# Patient Record
Sex: Male | Born: 1970 | Race: White | Hispanic: No | Marital: Married | Smoking: Former smoker
Health system: Southern US, Community
[De-identification: ages and names within clinical notes are randomized; demographics above are authoritative.]

## PROBLEM LIST (undated history)

## (undated) DIAGNOSIS — Z789 Other specified health status: Secondary | ICD-10-CM

## (undated) DIAGNOSIS — E785 Hyperlipidemia, unspecified: Secondary | ICD-10-CM

## (undated) DIAGNOSIS — E538 Deficiency of other specified B group vitamins: Secondary | ICD-10-CM

## (undated) DIAGNOSIS — D72829 Elevated white blood cell count, unspecified: Secondary | ICD-10-CM

## (undated) DIAGNOSIS — L409 Psoriasis, unspecified: Secondary | ICD-10-CM

## (undated) DIAGNOSIS — I10 Essential (primary) hypertension: Secondary | ICD-10-CM

## (undated) HISTORY — DX: Elevated white blood cell count, unspecified: D72.829

## (undated) HISTORY — PX: NO PAST SURGERIES: SHX2092

## (undated) HISTORY — DX: Deficiency of other specified B group vitamins: E53.8

## (undated) HISTORY — DX: Psoriasis, unspecified: L40.9

---

## 2004-07-19 ENCOUNTER — Other Ambulatory Visit: Payer: Self-pay

## 2004-07-20 ENCOUNTER — Observation Stay: Payer: Self-pay

## 2005-03-16 ENCOUNTER — Emergency Department: Payer: Self-pay | Admitting: Emergency Medicine

## 2006-01-30 ENCOUNTER — Emergency Department: Payer: Self-pay | Admitting: Emergency Medicine

## 2006-07-06 ENCOUNTER — Emergency Department: Payer: Self-pay | Admitting: Internal Medicine

## 2006-07-13 ENCOUNTER — Emergency Department: Payer: Self-pay | Admitting: Emergency Medicine

## 2006-07-13 ENCOUNTER — Other Ambulatory Visit: Payer: Self-pay

## 2006-07-19 ENCOUNTER — Emergency Department: Payer: Self-pay | Admitting: Emergency Medicine

## 2006-07-19 ENCOUNTER — Other Ambulatory Visit: Payer: Self-pay

## 2007-07-18 ENCOUNTER — Emergency Department: Payer: Self-pay | Admitting: Emergency Medicine

## 2008-05-13 ENCOUNTER — Emergency Department: Payer: Self-pay | Admitting: Internal Medicine

## 2008-07-01 ENCOUNTER — Emergency Department: Payer: Self-pay | Admitting: Emergency Medicine

## 2008-09-02 ENCOUNTER — Emergency Department: Payer: Self-pay | Admitting: Emergency Medicine

## 2008-09-07 ENCOUNTER — Inpatient Hospital Stay: Payer: Self-pay | Admitting: Psychiatry

## 2010-06-20 ENCOUNTER — Emergency Department: Payer: Self-pay | Admitting: Emergency Medicine

## 2010-11-11 ENCOUNTER — Emergency Department: Payer: Self-pay | Admitting: Emergency Medicine

## 2012-10-31 ENCOUNTER — Emergency Department: Payer: Self-pay | Admitting: Emergency Medicine

## 2014-02-09 ENCOUNTER — Emergency Department: Payer: Self-pay | Admitting: Emergency Medicine

## 2016-07-03 ENCOUNTER — Emergency Department
Admission: EM | Admit: 2016-07-03 | Discharge: 2016-07-03 | Disposition: A | Discharge status: Home / Self Care | Payer: Self-pay | Attending: Emergency Medicine | Admitting: Emergency Medicine

## 2016-07-03 ENCOUNTER — Emergency Department: Payer: Self-pay

## 2016-07-03 ENCOUNTER — Encounter: Payer: Self-pay | Admitting: Emergency Medicine

## 2016-07-03 DIAGNOSIS — M7712 Lateral epicondylitis, left elbow: Secondary | ICD-10-CM | POA: Insufficient documentation

## 2016-07-03 DIAGNOSIS — F17200 Nicotine dependence, unspecified, uncomplicated: Secondary | ICD-10-CM | POA: Insufficient documentation

## 2016-07-03 MED ORDER — KETOROLAC TROMETHAMINE 30 MG/ML IJ SOLN
30.0000 mg | Freq: Once | INTRAMUSCULAR | Status: AC
  Administered 2016-07-03: 30 mg via INTRAMUSCULAR
  Filled 2016-07-03: qty 1

## 2016-07-03 MED ORDER — MELOXICAM 15 MG PO TABS: 15.0000 mg | ORAL_TABLET | Freq: Every day | ORAL | 0 refills | Status: DC

## 2016-07-03 NOTE — ED Provider Notes (Signed)
[facility] Emergency Department Provider Note  ____________________________________________  Time seen: Approximately 11:02 PM  I have reviewed the triage vital signs and the nursing notes.   HISTORY  Chief Complaint Arm Pain    HPI Bruce Kane is a 45 y.o. male who presents emergency department complaining of left elbow pain. Patient denies any specific injury. He reports the pain is the lateral aspect of the elbow. Has been ongoing 3 weeks. It is slowly increasing in intensity. Patient will occasionally have pain radiating down to his fingers. Patient denies any shoulder pain. Patient is not taking medications prior to arrival. Patient reports that the pain is constant, worse with movement or lifting, described as sharp.   History reviewed. No pertinent past medical history.  There are no active problems to display for this patient.   No past surgical history on file.  Prior to Admission medications   Medication Sig Start Date End Date Taking? Authorizing Provider  meloxicam (MOBIC) 15 MG tablet Take 1 tablet (15 mg total) by mouth daily. 07/03/16   Delorise RoyalsJonathan D Cuthriell, PA-C    Allergies Review of patient's allergies indicates no known allergies.  History reviewed. No pertinent family history.  Social History Social History  Substance Use Topics  . Smoking status: Not on file  . Smokeless tobacco: Not on file  . Alcohol use Not on file     Review of Systems  Constitutional: No fever/chills Cardiovascular: no chest pain. Respiratory: no cough. No SOB. Musculoskeletal: Positive for left elbow pain Skin: Negative for rash, abrasions, lacerations, ecchymosis. Neurological: Negative for headaches, focal weakness or numbness. 10-point ROS otherwise negative.  ____________________________________________   PHYSICAL EXAM:  VITAL SIGNS: ED Triage Vitals  Enc Vitals Group     BP 07/03/16 2247 133/81     Pulse Rate 07/03/16 2247  71     Resp 07/03/16 2247 17     Temp 07/03/16 2247 97.9 F (36.6 C)     Temp Source 07/03/16 2247 Oral     SpO2 07/03/16 2247 99 %     Weight 07/03/16 2248 178 lb (80.7 kg)     Height 07/03/16 2248 5\' 8"  (1.727 m)     Head Circumference --      Peak Flow --      Pain Score 07/03/16 2248 8     Pain Loc --      Pain Edu? --      Excl. in GC? --      Constitutional: Alert and oriented. Well appearing and in no acute distress. Eyes: Conjunctivae are normal. PERRL. EOMI. Head: Atraumatic. Cardiovascular: Normal rate, regular rhythm. Normal S1 and S2.  Good peripheral circulation. Respiratory: Normal respiratory effort without tachypnea or retractions. Lungs CTAB. Good air entry to the bases with no decreased or absent breath sounds. Musculoskeletal: Full range of motion to all extremities. No gross deformities appreciated. No visible deformity to left elbow. Inspection. Mild edema to the lateral epicondyle region. Patient is very tender to palpation over the lateral epicondyle. No palpable abnormality. Patient is mildly tender to palpation posterior and medial aspects of the elbow. Full range of motion. Examination of the wrist and shoulder are unremarkable. Dorsalis pulse intact distally. Sensation intact 5 digits distally. Neurologic:  Normal speech and language. No gross focal neurologic deficits are appreciated.  Skin:  Skin is warm, dry and intact. No rash noted. Psychiatric: Mood and affect are normal. Speech and behavior are normal. Patient exhibits appropriate insight and judgement.  ____________________________________________   LABS (all labs ordered are listed, but only abnormal results are displayed)  Labs Reviewed - No data to display ____________________________________________  EKG   ____________________________________________  RADIOLOGY Festus BarrenI, Jonathan D Cuthriell, personally viewed and evaluated these images (plain radiographs) as part of my medical decision  making, as well as reviewing the written report by the radiologist.  Dg Elbow Complete Left  Result Date: 07/03/2016 CLINICAL DATA:  Nontraumatic left elbow pain for 10 days. EXAM: LEFT ELBOW - COMPLETE 3+ VIEW COMPARISON:  None. FINDINGS: There is no evidence of fracture, dislocation, or joint effusion. There is no evidence of arthropathy or other focal bone abnormality. Soft tissues are unremarkable. IMPRESSION: Negative. Electronically Signed   By: Ellery Plunkaniel R Mitchell M.D.   On: 07/03/2016 22:53    ____________________________________________    PROCEDURES  Procedure(s) performed:    Procedures    Medications  ketorolac (TORADOL) 30 MG/ML injection 30 mg (30 mg Intramuscular Given 07/03/16 2317)     ____________________________________________   INITIAL IMPRESSION / ASSESSMENT AND PLAN / ED COURSE  Pertinent labs & imaging results that were available during my care of the patient were reviewed by me and considered in my medical decision making (see chart for details).  Review of the [place] CSRS was performed in accordance of the [facility] prior to dispensing any controlled drugs.  Clinical Course    Patient's diagnosis is consistent with Lateral epicondylitis. X-ray was no acute osseous antibiotic. Exam is reassuring the patient be neurovascularly intact distally. No other concerning signs or symptoms. Patient is given Toradol injection and sling in the emergency department.. Patient will be discharged home with prescriptions for anti-inflammatories for symptom control. Patient is to follow up with orthopedics as needed or otherwise directed. Patient is given ED precautions to return to the ED for any worsening or new symptoms.     ____________________________________________  FINAL CLINICAL IMPRESSION(S) / ED DIAGNOSES  Final diagnoses:  Lateral epicondylitis of left elbow      NEW MEDICATIONS STARTED DURING THIS VISIT:  New Prescriptions   MELOXICAM (MOBIC) 15 MG  TABLET    Take 1 tablet (15 mg total) by mouth daily.        This chart was dictated using voice recognition software/Dragon. Despite best efforts to proofread, errors can occur which can change the meaning. Any change was purely unintentional.    Racheal PatchesJonathan D Cuthriell, PA-C 07/03/16 [id number]    Sharman CheekPhillip Stafford, MD 07/03/16 2351

## 2016-07-03 NOTE — ED Triage Notes (Signed)
Pt comes in c/o left elbow pain that started the weekend before last. Reports that the pain is throbbing and sharp in nature. Left elbow has noticeable swelling to it, pt reports unable to grip due to pain, limited ROM due to pain. No known injury.

## 2016-07-03 NOTE — ED Notes (Signed)
Discharge instructions reviewed with patient. Patient verbalized understanding. Patient ambulated to lobby without difficulty.   

## 2016-07-06 ENCOUNTER — Encounter: Payer: Self-pay | Admitting: Emergency Medicine

## 2016-07-06 ENCOUNTER — Emergency Department
Admission: EM | Admit: 2016-07-06 | Discharge: 2016-07-06 | Disposition: A | Discharge status: Home / Self Care | Payer: Self-pay | Attending: Emergency Medicine | Admitting: Emergency Medicine

## 2016-07-06 DIAGNOSIS — M7712 Lateral epicondylitis, left elbow: Secondary | ICD-10-CM | POA: Insufficient documentation

## 2016-07-06 DIAGNOSIS — F17200 Nicotine dependence, unspecified, uncomplicated: Secondary | ICD-10-CM | POA: Insufficient documentation

## 2016-07-06 DIAGNOSIS — Z791 Long term (current) use of non-steroidal anti-inflammatories (NSAID): Secondary | ICD-10-CM | POA: Insufficient documentation

## 2016-07-06 NOTE — Discharge Instructions (Signed)
Advised patient to call Dr. Rosita KeaMenz office this afternoon to schedule appointment. Continue to wear his sling and take anti-inflammatory medication as directed.

## 2016-07-06 NOTE — ED Notes (Signed)
See triage note  States he developed pain to left elbow about 3 weeks ago   Unsure of injury. Was seen on Monday but states pain is getting worse

## 2016-07-06 NOTE — ED Triage Notes (Signed)
Pt seen here 3 days ago for left elbow pain. States that the pain has increased and forearm is tingling.

## 2016-07-06 NOTE — ED Provider Notes (Signed)
[facility] Emergency Department Provider Note   ____________________________________________   None    (approximate)  I have reviewed the triage vital signs and the nursing notes.   HISTORY  Chief Complaint Joint Swelling    HPI Bruce Kane is a 45 y.o. male patient complaining of left elbow pain with no improvement since his last visit 3 days ago. Patient does not follow orthopedics as directed. He said unable to work secondary to the pain  and wearing the sling. Patient rates his pain as a 8/10. Patient described a pain as "sharp" intermittent numbness and tingling. Patient stated no relief with anti-inflammatory medications.  History reviewed. No pertinent past medical history.  There are no active problems to display for this patient.   History reviewed. No pertinent surgical history.  Prior to Admission medications   Medication Sig Start Date End Date Taking? Authorizing Provider  meloxicam (MOBIC) 15 MG tablet Take 1 tablet (15 mg total) by mouth daily. 07/03/16   Delorise RoyalsJonathan D Cuthriell, PA-C    Allergies Review of patient's allergies indicates no known allergies.  History reviewed. No pertinent family history.  Social History Social History  Substance Use Topics  . Smoking status: Current Some Day Smoker  . Smokeless tobacco: Never Used  . Alcohol use Yes    Review of Systems Constitutional: No fever/chills Eyes: No visual changes. ENT: No sore throat. Cardiovascular: Denies chest pain. Respiratory: Denies shortness of breath. Gastrointestinal: No abdominal pain.  No nausea, no vomiting.  No diarrhea.  No constipation. Genitourinary: Negative for dysuria. Musculoskeletal: Left elbow pain Skin: Negative for rash. Neurological: Negative for headaches, focal weakness or numbness.   ____________________________________________   PHYSICAL EXAM:  VITAL SIGNS: ED Triage Vitals  Enc Vitals Group     BP 07/06/16 1024  120/84     Pulse Rate 07/06/16 1024 70     Resp 07/06/16 1024 16     Temp 07/06/16 1024 98.7 F (37.1 C)     Temp Source 07/06/16 1024 Oral     SpO2 07/06/16 1024 97 %     Weight 07/06/16 1018 178 lb (80.7 kg)     Height 07/06/16 1018 5\' 8"  (1.727 m)     Head Circumference --      Peak Flow --      Pain Score --      Pain Loc --      Pain Edu? --      Excl. in GC? --     Constitutional: Alert and oriented. Well appearing and in no acute distress. Eyes: Conjunctivae are normal. PERRL. EOMI. Head: Atraumatic. Nose: No congestion/rhinnorhea. Mouth/Throat: Mucous membranes are moist.  Oropharynx non-erythematous. Neck: No stridor.  No cervical spine tenderness to palpation. Hematological/Lymphatic/Immunilogical: No cervical lymphadenopathy. Cardiovascular: Normal rate, regular rhythm. Grossly normal heart sounds.  Good peripheral circulation. Respiratory: Normal respiratory effort.  No retractions. Lungs CTAB. Gastrointestinal: Soft and nontender. No distention. No abdominal bruits. No CVA tenderness. Musculoskeletal: No obvious deformity to the left elbow. There is some mild edema but no erythema. Patient has some moderate guarding palpation lateral epicondyle area. Patient has full nuchal range of motion of the elbow. As the pulses are intact.  Neurologic:  Normal speech and language. No gross focal neurologic deficits are appreciated. No gait instability. Skin:  Skin is warm, dry and intact. No rash noted. Psychiatric: Mood and affect are normal. Speech and behavior are normal.  ____________________________________________   LABS (all labs ordered are listed, but only  abnormal results are displayed)  Labs Reviewed - No data to display ____________________________________________  EKG   ____________________________________________  RADIOLOGY   ____________________________________________   PROCEDURES  Procedure(s) performed: None  Procedures  Critical Care  performed: No  ____________________________________________   INITIAL IMPRESSION / ASSESSMENT AND PLAN / ED COURSE  Pertinent labs & imaging results that were available during my care of the patient were reviewed by me and considered in my medical decision making (see chart for details).  Lateral epicondylitis. Patient given discharge Instructions. Patient advised to contact orthopedic department to schedule an appointment. Patient given a work note and advised to continue wearing a sling and take anti-inflammatory medications.  Clinical Course     ____________________________________________   FINAL CLINICAL IMPRESSION(S) / ED DIAGNOSES  Final diagnoses:  Lateral epicondylitis of left elbow      NEW MEDICATIONS STARTED DURING THIS VISIT:  New Prescriptions   No medications on file     Note:  This document was prepared using Dragon voice recognition software and may include unintentional dictation errors.    Joni ReiningRonald K Smith, PA-C 07/06/16 1220    Sharman CheekPhillip Stafford, MD 07/07/16 [phone]

## 2017-04-30 ENCOUNTER — Emergency Department
Admission: EM | Admit: 2017-04-30 | Discharge: 2017-04-30 | Disposition: A | Discharge status: Home / Self Care | Payer: BLUE CROSS/BLUE SHIELD | Attending: Emergency Medicine | Admitting: Emergency Medicine

## 2017-04-30 DIAGNOSIS — R51 Headache: Secondary | ICD-10-CM | POA: Insufficient documentation

## 2017-04-30 DIAGNOSIS — F17200 Nicotine dependence, unspecified, uncomplicated: Secondary | ICD-10-CM | POA: Insufficient documentation

## 2017-04-30 DIAGNOSIS — M545 Low back pain: Secondary | ICD-10-CM | POA: Diagnosis present

## 2017-04-30 DIAGNOSIS — R509 Fever, unspecified: Secondary | ICD-10-CM | POA: Diagnosis not present

## 2017-04-30 DIAGNOSIS — N410 Acute prostatitis: Secondary | ICD-10-CM | POA: Insufficient documentation

## 2017-04-30 LAB — URINALYSIS, COMPLETE (UACMP) WITH MICROSCOPIC
BILIRUBIN URINE: NEGATIVE
Bacteria, UA: NONE SEEN
Glucose, UA: NEGATIVE mg/dL
HGB URINE DIPSTICK: NEGATIVE
KETONES UR: NEGATIVE mg/dL
Leukocytes, UA: NEGATIVE
NITRITE: NEGATIVE
PROTEIN: NEGATIVE mg/dL
RBC / HPF: NONE SEEN RBC/hpf (ref 0–5)
Specific Gravity, Urine: 1.01 (ref 1.005–1.030)
Squamous Epithelial / LPF: NONE SEEN
pH: 6 (ref 5.0–8.0)

## 2017-04-30 LAB — COMPREHENSIVE METABOLIC PANEL
ALK PHOS: 89 U/L (ref 38–126)
ALT: 18 U/L (ref 17–63)
ANION GAP: 7 (ref 5–15)
AST: 19 U/L (ref 15–41)
Albumin: 4.1 g/dL (ref 3.5–5.0)
BILIRUBIN TOTAL: 0.6 mg/dL (ref 0.3–1.2)
BUN: 15 mg/dL (ref 6–20)
CALCIUM: 9.3 mg/dL (ref 8.9–10.3)
CO2: 26 mmol/L (ref 22–32)
Chloride: 106 mmol/L (ref 101–111)
Creatinine, Ser: 0.97 mg/dL (ref 0.61–1.24)
GFR calc non Af Amer: >60 mL/min (ref >60)
GLUCOSE: 94 mg/dL (ref 65–99)
Potassium: 5.1 mmol/L (ref 3.5–5.1)
Sodium: 139 mmol/L (ref 135–145)
TOTAL PROTEIN: 7.4 g/dL (ref 6.5–8.1)

## 2017-04-30 LAB — CBC WITH DIFFERENTIAL/PLATELET
BASOS ABS: 0 10*3/uL (ref 0–0.1)
BASOS PCT: 0 %
EOS ABS: 0.1 10*3/uL (ref 0–0.7)
EOS PCT: 1 %
HEMATOCRIT: 44.6 % (ref 40.0–52.0)
Hemoglobin: 15.4 g/dL (ref 13.0–18.0)
Lymphocytes Relative: 27 %
Lymphs Abs: 2.7 10*3/uL (ref 1.0–3.6)
MCH: 30.4 pg (ref 26.0–34.0)
MCHC: 34.6 g/dL (ref 32.0–36.0)
MCV: 88 fL (ref 80.0–100.0)
Monocytes Absolute: 0.5 10*3/uL (ref 0.2–1.0)
Monocytes Relative: 5 %
Neutro Abs: 6.7 10*3/uL — ABNORMAL HIGH (ref 1.4–6.5)
Neutrophils Relative %: 67 %
Platelets: 204 10*3/uL (ref 150–440)
RBC: 5.07 MIL/uL (ref 4.40–5.90)
RDW: 14.5 % (ref 11.5–14.5)
WBC: 10 10*3/uL (ref 3.8–10.6)

## 2017-04-30 MED ORDER — HYDROCODONE-ACETAMINOPHEN 5-325 MG PO TABS: 1.0000 | ORAL_TABLET | Freq: Four times a day (QID) | ORAL | 0 refills | Status: DC | PRN

## 2017-04-30 NOTE — ED Triage Notes (Signed)
Pt c/o lower back pain, states he was seen at urgent care on Thursday and dx with prostatitis and rx levaquin, states the medication is giving him a HA , states they did not Rx anything for pain and he is wanting something for the pain.

## 2017-04-30 NOTE — ED Provider Notes (Signed)
[facility] Emergency Department Provider Note  ____________________________________________   First MD Initiated Contact with Patient 04/30/17 1017     (approximate)  I have reviewed the triage vital signs and the nursing notes.   HISTORY  Chief Complaint Back Pain and Headache   HPI Bruce Kane is a 46 y.o. male is her complaint of low back pain. Patient states that he was diagnosed 4 days ago for prostatitis and given a prescription for Levaquin. Patient states that he continued to have some fever for 2 days after that. He continues to have low back pain. He was seen at a urgent care where a urinalysis was done but no prostate exam. Patient states he became concerned when he saw blood in his ejaculate after having intercourse with his wife. This prompted him to be seen at the urgent care. He denies any other urinary symptoms, penile discharge, urinary tract infections. He rates his pain as an 8 out of 10.   History reviewed. No pertinent past medical history.  There are no active problems to display for this patient.   History reviewed. No pertinent surgical history.  Prior to Admission medications   Medication Sig Start Date End Date Taking? Authorizing Provider  HYDROcodone-acetaminophen (NORCO/VICODIN) 5-325 MG tablet Take 1 tablet by mouth every 6 (six) hours as needed for moderate pain. 04/30/17   Tommi Rumps, PA-C    Allergies Patient has no known allergies.  No family history on file.  Social History Social History  Substance Use Topics  . Smoking status: Current Some Day Smoker  . Smokeless tobacco: Never Used  . Alcohol use Yes    Review of Systems Constitutional: No fever/chills Cardiovascular: Denies chest pain. Respiratory: Denies shortness of breath. Gastrointestinal: No abdominal pain.  No nausea, no vomiting.   Genitourinary: Negative for dysuria. Musculoskeletal: Positive back pain. Neurological: Negative  for headaches, focal weakness or numbness. ____________________________________________   PHYSICAL EXAM:  VITAL SIGNS: ED Triage Vitals  Enc Vitals Group     BP 04/30/17 0913 (!) 139/104     Pulse Rate 04/30/17 0913 71     Resp 04/30/17 0913 14     Temp 04/30/17 0913 98.7 F (37.1 C)     Temp Source 04/30/17 0913 Oral     SpO2 04/30/17 0913 98 %     Weight 04/30/17 0914 172 lb (78 kg)     Height 04/30/17 0914 5\' 9"  (1.753 m)     Head Circumference --      Peak Flow --      Pain Score 04/30/17 0852 8     Pain Loc --      Pain Edu? --      Excl. in GC? --    Constitutional: Alert and oriented. Well appearing and in no acute distress. Eyes: Conjunctivae are normal.  Head: Atraumatic. Neck: No stridor.   Cardiovascular: Normal rate, regular rhythm. Grossly normal heart sounds.  Good peripheral circulation. Respiratory: Normal respiratory effort.  No retractions. Lungs CTAB. Gastrointestinal: Soft and nontender. No distention.  No CVA tenderness.  Rectal exam no masses or bleeding is noted. No enlargement of prostate is appreciated. On examination of prostate patient states this reproduces his back pain. Hemoccult was negative for blood. Musculoskeletal: Moves upper and lower extremities without any difficulty. Normal gait was noted. Neurologic:  Normal speech and language. No gross focal neurologic deficits are appreciated.  Skin:  Skin is warm, dry and intact. Psychiatric: Mood and affect are normal.  Speech and behavior are normal.  ____________________________________________   LABS (all labs ordered are listed, but only abnormal results are displayed)  Labs Reviewed  URINALYSIS, COMPLETE (UACMP) WITH MICROSCOPIC - Abnormal; Notable for the following:       Result Value   Color, Urine YELLOW (*)    APPearance CLEAR (*)    All other components within normal limits  CBC WITH DIFFERENTIAL/PLATELET - Abnormal; Notable for the following:    Neutro Abs 6.7 (*)    All other  components within normal limits  COMPREHENSIVE METABOLIC PANEL     PROCEDURES  Procedure(s) performed: None  Procedures  Critical Care performed: No  ____________________________________________   INITIAL IMPRESSION / ASSESSMENT AND PLAN / ED COURSE  Pertinent labs & imaging results that were available during my care of the patient were reviewed by me and considered in my medical decision making (see chart for details).  Patient will finish Levaquin. He is given a prescription for Norco one every 6 hours as needed for pain. He also will make an appointment with [facility] urological for further evaluation of his prostate. Patient is aware that he cannot drive or operate machinery while taking pain medication. Urinalysis and blood work was reassuring.   ____________________________________________   FINAL CLINICAL IMPRESSION(S) / ED DIAGNOSES  Final diagnoses:  Acute prostatitis without hematuria      NEW MEDICATIONS STARTED DURING THIS VISIT:  Discharge Medication List as of 04/30/2017 11:27 AM    START taking these medications   Details  HYDROcodone-acetaminophen (NORCO/VICODIN) 5-325 MG tablet Take 1 tablet by mouth every 6 (six) hours as needed for moderate pain., Starting Mon 04/30/2017, Print         Note:  This document was prepared using Dragon voice recognition software and may include unintentional dictation errors.    Tommi Rumps, PA-C 04/30/17 1215    Minna Antis, MD 04/30/17 [phone]

## 2017-04-30 NOTE — ED Notes (Addendum)
See triage note  Presents with pain to lower back and into groin area  Was recently dx'd with prostatitis   Was given antibiotics but having increased pain

## 2017-04-30 NOTE — Discharge Instructions (Signed)
Continue Levaquin until completely finished. Begin taking Norco as needed for pain. Do not take this medication while driving or operating machinery. Call  urological to make a follow-up appointment.

## 2017-06-06 ENCOUNTER — Emergency Department: Payer: BLUE CROSS/BLUE SHIELD

## 2017-06-06 ENCOUNTER — Encounter: Payer: Self-pay | Admitting: Emergency Medicine

## 2017-06-06 ENCOUNTER — Observation Stay
Admission: EM | Admit: 2017-06-06 | Discharge: 2017-06-08 | Disposition: A | Discharge status: Home / Self Care | Payer: BLUE CROSS/BLUE SHIELD | Attending: Internal Medicine | Admitting: Internal Medicine

## 2017-06-06 DIAGNOSIS — F17210 Nicotine dependence, cigarettes, uncomplicated: Secondary | ICD-10-CM | POA: Diagnosis not present

## 2017-06-06 DIAGNOSIS — R55 Syncope and collapse: Secondary | ICD-10-CM | POA: Diagnosis present

## 2017-06-06 DIAGNOSIS — G839 Paralytic syndrome, unspecified: Secondary | ICD-10-CM | POA: Diagnosis not present

## 2017-06-06 HISTORY — DX: Other specified health status: Z78.9

## 2017-06-06 LAB — URINALYSIS, COMPLETE (UACMP) WITH MICROSCOPIC
Bilirubin Urine: NEGATIVE
GLUCOSE, UA: NEGATIVE mg/dL
HGB URINE DIPSTICK: NEGATIVE
KETONES UR: NEGATIVE mg/dL
LEUKOCYTES UA: NEGATIVE
Nitrite: NEGATIVE
PROTEIN: NEGATIVE mg/dL
RBC / HPF: NONE SEEN RBC/hpf (ref 0–5)
Specific Gravity, Urine: 1.02 (ref 1.005–1.030)
pH: 5 (ref 5.0–8.0)

## 2017-06-06 LAB — CBC
HEMATOCRIT: 48.2 % (ref 40.0–52.0)
Hemoglobin: 17 g/dL (ref 13.0–18.0)
MCH: 30.7 pg (ref 26.0–34.0)
MCHC: 35.2 g/dL (ref 32.0–36.0)
MCV: 87.3 fL (ref 80.0–100.0)
PLATELETS: 212 10*3/uL (ref 150–440)
RBC: 5.51 MIL/uL (ref 4.40–5.90)
RDW: 13.8 % (ref 11.5–14.5)
WBC: 9.8 10*3/uL (ref 3.8–10.6)

## 2017-06-06 LAB — URINE DRUG SCREEN, QUALITATIVE (ARMC ONLY)
Amphetamines, Ur Screen: NOT DETECTED
BENZODIAZEPINE, UR SCRN: NOT DETECTED
Barbiturates, Ur Screen: NOT DETECTED
Cannabinoid 50 Ng, Ur ~~LOC~~: POSITIVE — AB
Cocaine Metabolite,Ur ~~LOC~~: NOT DETECTED
MDMA (ECSTASY) UR SCREEN: NOT DETECTED
Methadone Scn, Ur: NOT DETECTED
Opiate, Ur Screen: NOT DETECTED
Phencyclidine (PCP) Ur S: NOT DETECTED
TRICYCLIC, UR SCREEN: NOT DETECTED

## 2017-06-06 LAB — BASIC METABOLIC PANEL
Anion gap: 7 (ref 5–15)
BUN: 17 mg/dL (ref 6–20)
CHLORIDE: 102 mmol/L (ref 101–111)
CO2: 28 mmol/L (ref 22–32)
CREATININE: 1.01 mg/dL (ref 0.61–1.24)
Calcium: 9.1 mg/dL (ref 8.9–10.3)
GFR calc Af Amer: >60 mL/min (ref >60)
GLUCOSE: 95 mg/dL (ref 65–99)
POTASSIUM: 3.9 mmol/L (ref 3.5–5.1)
SODIUM: 137 mmol/L (ref 135–145)

## 2017-06-06 NOTE — ED Notes (Signed)
Radiology (Dr. Micheline Maze) called for Dr. Don Perking

## 2017-06-06 NOTE — ED Provider Notes (Signed)
[facility] Emergency Department Provider Note  ____________________________________________  Time seen: Approximately 8:43 PM  I have reviewed the triage vital signs and the nursing notes.   HISTORY  Chief Complaint Loss of Consciousness   HPI Bruce Kane is a 46 y.o. male no significant past medical history who presents for evaluation of syncopal  episode. According to patient's wife became back from a week in [place] this morning.Patient was complaining of dizziness since earlier today. They were at a friend's house having dinner when patient had a syncopal episode. He was assisted to the floor by his wife and did not sustain any trauma. Patient had LOC for couple of seconds, no seizure-like activity, no urinary or bowel loss, no tongue trauma. Patient regained consciousness and he was alert and oriented and back to his baseline immediately however patient was unable to move his body from the neck down. Patient denies any pain at this time. No neck pain, no HA, no cp, no abdominal pain. He reports that he is able to feel pressure but not pain in his entire body. No drugs.  Past Medical History:  Diagnosis Date  . Patient denies medical problems     Patient Active Problem List   Diagnosis Date Noted  . Loss of consciousness (HCC) 06/06/2017  . 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 NO PAST SURGERIES      Prior to Admission medications   Medication Sig Start Date End Date Taking? Authorizing Provider  HYDROcodone-acetaminophen (NORCO/VICODIN) 5-325 MG tablet Take 1 tablet by mouth every 6 (six) hours as needed for moderate pain. 04/30/17   Tommi Rumps, PA-C    Allergies Patient has no known allergies.  Family History  Problem Relation Age of Onset  . Family history unknown: Yes    Social History Social History  Substance Use Topics  . Smoking status: Current Some Day Smoker  .  Smokeless tobacco: Never Used  . Alcohol use Yes    Review of Systems  Constitutional: Negative for fever. Eyes: Negative for visual changes. ENT: Negative for sore throat. Neck: No neck pain  Cardiovascular: Negative for chest pain. Respiratory: Negative for shortness of breath. Gastrointestinal: Negative for abdominal pain, vomiting or diarrhea. Genitourinary: Negative for dysuria. Musculoskeletal: Negative for back pain. Skin: Negative for rash. Neurological: Negative for headaches. + syncope and inability to move body Psych: No SI or HI  ____________________________________________   PHYSICAL EXAM:  VITAL SIGNS: ED Triage Vitals  Enc Vitals Group     BP 06/06/17 2017 (!) 150/92     Pulse Rate 06/06/17 2017 72     Resp --      Temp 06/06/17 2017 98.2 F (36.8 C)     Temp Source 06/06/17 2017 Oral     SpO2 06/06/17 2015 98 %     Weight 06/06/17 2017 172 lb (78 kg)     Height --      Head Circumference --      Peak Flow --      Pain Score --      Pain Loc --      Pain Edu? --      Excl. in GC? --     Constitutional: Alert and oriented, no distress.  HEENT:      Head: Normocephalic and atraumatic.         Eyes: Conjunctivae are normal. Sclera is non-icteric.       Mouth/Throat: Mucous  membranes are moist.       Neck: Supple with no signs of meningismus. No c-spine ttp Cardiovascular: Regular rate and rhythm. No murmurs, gallops, or rubs. 2+ symmetrical distal pulses are present in all extremities. No JVD. Respiratory: Normal respiratory effort. Lungs are clear to auscultation bilaterally. No wheezes, crackles, or rhonchi.  Gastrointestinal: Soft, non tender, and non distended with positive bowel sounds. No rebound or guarding. Musculoskeletal: Nontender with normal range of motion in all extremities. No edema, cyanosis, or erythema of extremities. Neurologic: Normal speech and language. Face is symmetric. Patient is able to barely move the fingers in b/l hands,  but unable to move anything below the neck. Has no sensation to pain but normal sensation to pressure, DTRs 1+ x 4 extremities. PERRL 3 mm. Opens eyes spontaneously.  Skin: Skin is warm, dry and intact. No rash noted. Psychiatric: Mood and affect are normal. Speech and behavior are normal.  ____________________________________________   LABS (all labs ordered are listed, but only abnormal results are displayed)  Labs Reviewed  URINALYSIS, COMPLETE (UACMP) WITH MICROSCOPIC - Abnormal; Notable for the following:       Result Value   Color, Urine YELLOW (*)    APPearance HAZY (*)    Bacteria, UA RARE (*)    Squamous Epithelial / LPF 0-5 (*)    All other components within normal limits  URINE DRUG SCREEN, QUALITATIVE ([facility] ONLY) - Abnormal; Notable for the following:    Cannabinoid 50 Ng, Ur Melvin POSITIVE (*)    All other components within normal limits  BASIC METABOLIC PANEL  CBC  CBG MONITORING, ED   ____________________________________________  EKG  ED ECG REPORT I, Nita Sickle, the attending physician, personally viewed and interpreted this ECG.  normal sinus rhythm, rate of 74, normal intervals, normal axis, no ST elevations or depressions.  ____________________________________________  RADIOLOGY  CT head and cspine:  No acute intracranial findings. No acute cervical spine injury. Mild spondylosis throughout the cervical spine with mild disc disease at the C6-7 level in addition to moderate bilateral neural foraminal narrowing at the C6-7 level. ____________________________________________   PROCEDURES  Procedure(s) performed: None Procedures Critical Care performed:  None ____________________________________________   INITIAL IMPRESSION / ASSESSMENT AND PLAN / ED COURSE  46 y.o. male no significant past medical history who presents for evaluation of syncopal episode and now unable to move from the neck down. patient unable to move from the neck down with absent  sensation to pain. Reflexes are  reduced per present. No history of trauma. No thunderclap headache. head CT and CT neck negative. Differential diagnoses including demyelinating disorder such as  transverse myelitis or GBS however unclear how this relates to the syncopal episode and also these diseases usually do not happen so suddenly. Teleneurology consulted for evaluation.    _________________________ 11:17 PM on 06/06/2017 -----------------------------------------  Teleneurology recommended inpatient for further evaluation  Pertinent labs & imaging results that were available during my care of the patient were reviewed by me and considered in my medical decision making (see chart for details).    ____________________________________________   FINAL CLINICAL IMPRESSION(S) / ED DIAGNOSES  Final diagnoses:  Syncope, unspecified syncope type  Paralysis (HCC)      NEW MEDICATIONS STARTED DURING THIS VISIT:  New Prescriptions   No medications on file     Note:  This document was prepared using Dragon voice recognition software and may include unintentional dictation errors.    Nita Sickle, MD 06/06/17 [phone]

## 2017-06-06 NOTE — H&P (Signed)
[facility] [place] [facility]   PATIENT NAME: Bruce Kane    MR#:  [id number]  DATE OF BIRTH:  12-Apr-1971  DATE OF ADMISSION:  06/06/2017  PRIMARY CARE PHYSICIAN: Patient, No Pcp Per   REQUESTING/REFERRING PHYSICIAN: Don Perking, MD  CHIEF COMPLAINT:   Chief Complaint  Patient presents with  . Loss of Consciousness    HISTORY OF PRESENT ILLNESS:  Bruce Kane  is a 46 y.o. male who presents with An episode including loss of consciousness tonight followed by temporary paralysis from the neck down as well as sensory changes. Patient states he is feeling somewhat dizzy earlier today, and around dinnertime felt like he was going to pass out. He did lose consciousness. He woke up after a short while, and was unable to move from the neck down. Here in the ED on exam he was found to have some sensory deficit by the ED physician as well. However, the symptoms resolved. Hospitals were called for admission and further evaluation  PAST MEDICAL HISTORY:   Past Medical History:  Diagnosis Date  . Patient denies medical problems     PAST SURGICAL HISTORY:   Past Surgical History:  Procedure Laterality Date  . NO PAST SURGERIES      SOCIAL HISTORY:   Social History  Substance Use Topics  . Smoking status: Current Some Day Smoker  . Smokeless tobacco: Never Used  . Alcohol use Yes    FAMILY HISTORY:   Family History  Problem Relation Age of Onset  . Family history unknown: Yes    DRUG ALLERGIES:  No Known Allergies  MEDICATIONS AT HOME:   Prior to Admission medications   Medication Sig Start Date End Date Taking? Authorizing Provider  HYDROcodone-acetaminophen (NORCO/VICODIN) 5-325 MG tablet Take 1 tablet by mouth every 6 (six) hours as needed for moderate pain. 04/30/17   Tommi Rumps, PA-C    REVIEW OF SYSTEMS:  Review of Systems  Constitutional: Negative for chills, fever, malaise/fatigue and weight loss.  HENT: Negative  for ear pain, hearing loss and tinnitus.   Eyes: Negative for blurred vision, double vision, pain and redness.  Respiratory: Negative for cough, hemoptysis and shortness of breath.   Cardiovascular: Negative for chest pain, palpitations, orthopnea and leg swelling.  Gastrointestinal: Negative for abdominal pain, constipation, diarrhea, nausea and vomiting.  Genitourinary: Negative for dysuria, frequency and hematuria.  Musculoskeletal: Negative for back pain, joint pain and neck pain.  Skin:       No acne, rash, or lesions  Neurological: Positive for sensory change, focal weakness and loss of consciousness. Negative for dizziness, tremors and weakness.  Endo/Heme/Allergies: Negative for polydipsia. Does not bruise/bleed easily.  Psychiatric/Behavioral: Negative for depression. The patient is not nervous/anxious and does not have insomnia.      VITAL SIGNS:   Vitals:   06/06/17 2015 06/06/17 2017  BP:  (!) 150/92  Pulse:  72  Temp:  98.2 F (36.8 C)  TempSrc:  Oral  SpO2: 98% 97%  Weight:  78 kg (172 lb)   Wt Readings from Last 3 Encounters:  06/06/17 78 kg (172 lb)  04/30/17 78 kg (172 lb)  07/06/16 79.4 kg (175 lb)    PHYSICAL EXAMINATION:  Physical Exam  Vitals reviewed. Constitutional: He is oriented to person, place, and time. He appears well-developed and well-nourished. No distress.  HENT:  Head: Normocephalic and atraumatic.  Mouth/Throat: Oropharynx is clear and moist.  Eyes: Pupils are equal, round, and reactive to light. Conjunctivae  and EOM are normal. No scleral icterus.  Neck: Normal range of motion. Neck supple. No JVD present. No thyromegaly present.  Cardiovascular: Normal rate, regular rhythm and intact distal pulses.  Exam reveals no gallop and no friction rub.   No murmur heard. Respiratory: Effort normal and breath sounds normal. No respiratory distress. He has no wheezes. He has no rales.  GI: Soft. Bowel sounds are normal. He exhibits no distension.  There is no tenderness.  Musculoskeletal: Normal range of motion. He exhibits no edema.  No arthritis, no gout  Lymphadenopathy:    He has no cervical adenopathy.  Neurological: He is alert and oriented to person, place, and time. No cranial nerve deficit.  No acute focal deficit  Skin: Skin is warm and dry. No rash noted. No erythema.  Psychiatric: He has a normal mood and affect. His behavior is normal. Judgment and thought content normal.    LABORATORY PANEL:   CBC  Recent Labs Lab 06/06/17 2017  WBC 9.8  HGB 17.0  HCT 48.2  PLT 212   ------------------------------------------------------------------------------------------------------------------  Chemistries   Recent Labs Lab 06/06/17 2017  NA 137  K 3.9  CL 102  CO2 28  GLUCOSE 95  BUN 17  CREATININE 1.01  CALCIUM 9.1   ------------------------------------------------------------------------------------------------------------------  Cardiac Enzymes No results for input(s): TROPONINI in the last 168 hours. ------------------------------------------------------------------------------------------------------------------  RADIOLOGY:  Ct Head Wo Contrast  Result Date: 06/06/2017 CLINICAL DATA:  Intermittent episodes of dizziness past 10 days with syncopal episode tonight and loss of consciousness less than 1 minute. EXAM: CT HEAD WITHOUT CONTRAST CT CERVICAL SPINE WITHOUT CONTRAST TECHNIQUE: Multidetector CT imaging of the head and cervical spine was performed following the standard protocol without intravenous contrast. Multiplanar CT image reconstructions of the cervical spine were also generated. COMPARISON:  Head CT 10/31/2012, 07/01/2008 and cervical spine CT 10/31/2012 FINDINGS: CT HEAD FINDINGS Brain: The ventricles, cisterns and other CSF spaces are within normal. Septum pellucidum difficult to visualize although better visualized on prior exams. No mass, mass effect, shift of midline structures or acute  hemorrhage. No evidence of acute infarction. Vascular: No hyperdense vessel or unexpected calcification. Skull: Normal. Negative for fracture or focal lesion. Sinuses/Orbits: No acute finding. Other: None. CT CERVICAL SPINE FINDINGS Alignment: Normal. Skull base and vertebrae: There is mild spondylosis throughout the cervical spine. Vertebral body heights are maintained. C1-2 articulation is within normal. Minimal uncovertebral joint spurring is present as well as mild facet arthropathy. No acute fracture or subluxation. Moderate bilateral neural foramina narrowing at the C6-7 level. Soft tissues and spinal canal: No prevertebral fluid or swelling. No visible canal hematoma. Disc levels:  Mild disc space narrowing at the C6-7 level. Upper chest: Negative. Other: None. IMPRESSION: No acute intracranial findings. No acute cervical spine injury. Mild spondylosis throughout the cervical spine with mild disc disease at the C6-7 level in addition to moderate bilateral neural foraminal narrowing at the C6-7 level. Electronically Signed   By: Elberta Fortis M.D.   On: 06/06/2017 21:00   Ct Cervical Spine Wo Contrast  Result Date: 06/06/2017 CLINICAL DATA:  Intermittent episodes of dizziness past 10 days with syncopal episode tonight and loss of consciousness less than 1 minute. EXAM: CT HEAD WITHOUT CONTRAST CT CERVICAL SPINE WITHOUT CONTRAST TECHNIQUE: Multidetector CT imaging of the head and cervical spine was performed following the standard protocol without intravenous contrast. Multiplanar CT image reconstructions of the cervical spine were also generated. COMPARISON:  Head CT 10/31/2012, 07/01/2008 and cervical spine CT 10/31/2012  FINDINGS: CT HEAD FINDINGS Brain: The ventricles, cisterns and other CSF spaces are within normal. Septum pellucidum difficult to visualize although better visualized on prior exams. No mass, mass effect, shift of midline structures or acute hemorrhage. No evidence of acute infarction.  Vascular: No hyperdense vessel or unexpected calcification. Skull: Normal. Negative for fracture or focal lesion. Sinuses/Orbits: No acute finding. Other: None. CT CERVICAL SPINE FINDINGS Alignment: Normal. Skull base and vertebrae: There is mild spondylosis throughout the cervical spine. Vertebral body heights are maintained. C1-2 articulation is within normal. Minimal uncovertebral joint spurring is present as well as mild facet arthropathy. No acute fracture or subluxation. Moderate bilateral neural foramina narrowing at the C6-7 level. Soft tissues and spinal canal: No prevertebral fluid or swelling. No visible canal hematoma. Disc levels:  Mild disc space narrowing at the C6-7 level. Upper chest: Negative. Other: None. IMPRESSION: No acute intracranial findings. No acute cervical spine injury. Mild spondylosis throughout the cervical spine with mild disc disease at the C6-7 level in addition to moderate bilateral neural foraminal narrowing at the C6-7 level. Electronically Signed   By: Elberta Fortis M.D.   On: 06/06/2017 21:00    EKG:   Orders placed or performed during the hospital encounter of 06/06/17  . ED EKG  . ED EKG  . EKG 12-Lead  . EKG 12-Lead    IMPRESSION AND PLAN:  Principal Problem:   Loss of consciousness (HCC) - unclear etiology, but given his story and presentation there is possibility for something like Todd's paralysis, see below. However, we will get an echocardiogram in addition to possible seizure workup. Active Problems:   Paralysis (HCC) - this is temporary, very possibly something like Todd paralysis. Neurology consult for workup as above  All the records are reviewed and case discussed with ED provider. Management plans discussed with the patient and/or family.  DVT PROPHYLAXIS: SubQ lovenox  GI PROPHYLAXIS: None  ADMISSION STATUS: Observation  CODE STATUS: Full Code Status History    This patient does not have a recorded code status. Please follow your  organizational policy for patients in this situation.      TOTAL TIME TAKING CARE OF THIS PATIENT: 40 minutes.   Deionna Marcantonio FIELDING 06/06/2017, 11:30 PM  [facility]  Office  [phone]  CC: Primary care physician; Patient, No Pcp Per  Note:  This document was prepared using Dragon voice recognition software and may include unintentional dictation errors.

## 2017-06-06 NOTE — ED Triage Notes (Addendum)
Pt arrived via EMS from home with EMS reporting pt has had intermittent episodes of dizziness x10 days and today at approximately 1920 when he had a syncopal episode with LOC for approximate 35-45 seconds. Pt is alert and oriented to voice and is answering questions but unable to move bilateral extremities. MD at bedside for further evaluation.

## 2017-06-07 ENCOUNTER — Observation Stay: Payer: BLUE CROSS/BLUE SHIELD

## 2017-06-07 DIAGNOSIS — G839 Paralytic syndrome, unspecified: Secondary | ICD-10-CM | POA: Diagnosis not present

## 2017-06-07 DIAGNOSIS — R4020 Unspecified coma: Secondary | ICD-10-CM | POA: Diagnosis not present

## 2017-06-07 DIAGNOSIS — R55 Syncope and collapse: Secondary | ICD-10-CM

## 2017-06-07 LAB — TSH: TSH: 0.847 u[IU]/mL (ref 0.350–4.500)

## 2017-06-07 LAB — VITAMIN B12: VITAMIN B 12: 166 pg/mL — AB (ref 180–914)

## 2017-06-07 MED ORDER — NICOTINE 14 MG/24HR TD PT24
14.0000 mg | MEDICATED_PATCH | TRANSDERMAL | Status: DC
  Administered 2017-06-07: 22:00:00 14 mg via TRANSDERMAL
  Filled 2017-06-07: qty 1

## 2017-06-07 MED ORDER — NICOTINE 14 MG/24HR TD PT24
14.0000 mg | MEDICATED_PATCH | Freq: Every day | TRANSDERMAL | Status: DC
  Administered 2017-06-07: 14 mg via TRANSDERMAL

## 2017-06-07 MED ORDER — ONDANSETRON HCL 4 MG PO TABS: 4.0000 mg | ORAL_TABLET | Freq: Four times a day (QID) | ORAL | Status: DC | PRN

## 2017-06-07 MED ORDER — ACETAMINOPHEN 325 MG PO TABS: 650.0000 mg | ORAL_TABLET | ORAL | Status: DC | PRN

## 2017-06-07 MED ORDER — IOPAMIDOL (ISOVUE-370) INJECTION 76%
75.0000 mL | Freq: Once | INTRAVENOUS | Status: AC | PRN
  Administered 2017-06-07: 75 mL via INTRAVENOUS

## 2017-06-07 MED ORDER — ACETAMINOPHEN 650 MG RE SUPP: 650.0000 mg | RECTAL | Status: DC | PRN

## 2017-06-07 MED ORDER — NICOTINE 14 MG/24HR TD PT24
MEDICATED_PATCH | TRANSDERMAL | Status: AC
  Filled 2017-06-07: qty 1

## 2017-06-07 MED ORDER — ENOXAPARIN SODIUM 40 MG/0.4ML ~~LOC~~ SOLN
40.0000 mg | SUBCUTANEOUS | Status: DC
  Administered 2017-06-07: 22:00:00 40 mg via SUBCUTANEOUS
  Filled 2017-06-07: qty 0.4

## 2017-06-07 MED ORDER — LORAZEPAM 2 MG/ML IJ SOLN: 1.0000 mg | INTRAMUSCULAR | Status: DC | PRN

## 2017-06-07 MED ORDER — HYDROCODONE-ACETAMINOPHEN 5-325 MG PO TABS: 1.0000 | ORAL_TABLET | Freq: Four times a day (QID) | ORAL | Status: DC | PRN

## 2017-06-07 MED ORDER — SODIUM CHLORIDE 0.9 % IV SOLN
75.0000 mL/h | INTRAVENOUS | Status: DC
  Administered 2017-06-07 – 2017-06-08 (×2): 75 mL/h via INTRAVENOUS

## 2017-06-07 MED ORDER — ONDANSETRON HCL 4 MG/2ML IJ SOLN: 4.0000 mg | Freq: Four times a day (QID) | INTRAMUSCULAR | Status: DC | PRN

## 2017-06-07 NOTE — ED Notes (Signed)
Family at bedside. 

## 2017-06-07 NOTE — Progress Notes (Signed)
Medications administered by student RN [id number] with supervision of Clinical Instructor Kadian Barcellos MSN, RN-BC or patient's assigned RN.   

## 2017-06-07 NOTE — Consult Note (Signed)
Reason for Consult:Syncope Referring Physician: Sherryll Burger  CC: Syncope  HPI: Bruce Kane is an 46 y.o. male who reports that he has been having episodic dizziness for the past two weeks.  On yesterday after eating starting feeling dizzy.  Was getting up to walk around and passed out.  Wife was present.  Noted no tonic clonic movements or B/B incontinence.  When he came to the patient was numb all over including his face and head.  Was also unable to move.  Patient reports slow and gradual improvement in his symptoms and is now at baseline.    Past Medical History:  Diagnosis Date  . Patient denies medical problems     Past Surgical History:  Procedure Laterality Date  . NO PAST SURGERIES      Family History  Problem Relation Age of Onset  . Family history unknown: Yes    Social History:  reports that he has been smoking Cigarettes.  He has been smoking about 1.00 pack per day. He has never used smokeless tobacco. He reports that he drinks alcohol. He reports that he does not use drugs.  No Known Allergies  Medications:  I have reviewed the patient's current medications. Prior to Admission:  Prescriptions Prior to Admission  Medication Sig Dispense Refill Last Dose  . HYDROcodone-acetaminophen (NORCO/VICODIN) 5-325 MG tablet Take 1 tablet by mouth every 6 (six) hours as needed for moderate pain. (Patient not taking: Reported on 06/06/2017) 15 tablet 0 Completed Course at Unknown time   Scheduled: . enoxaparin (LOVENOX) injection  40 mg Subcutaneous Q24H  . nicotine  14 mg Transdermal Q24H    ROS: History obtained from the patient  General ROS: negative for - chills, fatigue, fever, night sweats, weight gain or weight loss Psychological ROS: negative for - behavioral disorder, hallucinations, memory difficulties, mood swings or suicidal ideation Ophthalmic ROS: negative for - blurry vision, double vision, eye pain or loss of vision ENT ROS: negative for - epistaxis, nasal  discharge, oral lesions, sore throat, tinnitus Allergy and Immunology ROS: negative for - hives or itchy/watery eyes Hematological and Lymphatic ROS: negative for - bleeding problems, bruising or swollen lymph nodes Endocrine ROS: negative for - galactorrhea, hair pattern changes, polydipsia/polyuria or temperature intolerance Respiratory ROS: negative for - cough, hemoptysis, shortness of breath or wheezing Cardiovascular ROS: negative for - chest pain, dyspnea on exertion, edema or irregular heartbeat Gastrointestinal ROS: negative for - abdominal pain, diarrhea, hematemesis, nausea/vomiting or stool incontinence Genito-Urinary ROS: negative for - dysuria, hematuria, incontinence or urinary frequency/urgency Musculoskeletal ROS: negative for - joint swelling or muscular weakness Neurological ROS: as noted in HPI Dermatological ROS: negative for rash and skin lesion changes  Physical Examination: Blood pressure 129/76, pulse (!) 57, temperature 97.7 F (36.5 C), temperature source Oral, resp. rate 20, height  (1.702 m), weight 78 kg (172 lb), SpO2 96 %.  HEENT-  Normocephalic, no lesions, without obvious abnormality.  Normal external eye and conjunctiva.  Normal TM's bilaterally.  Normal auditory canals and external ears. Normal external nose, mucus membranes and septum.  Normal pharynx. Cardiovascular- S1, S2 normal, pulses palpable throughout   Lungs- chest clear, no wheezing, rales, normal symmetric air entry Abdomen- soft, non-tender; bowel sounds normal; no masses,  no organomegaly Extremities- no edema Lymph-no adenopathy palpable Musculoskeletal-neck pain Skin-warm and dry, no hyperpigmentation, vitiligo, or suspicious lesions  Neurological Examination   Mental Status: Alert, oriented, thought content appropriate.  Speech fluent without evidence of aphasia.  Able to follow 3  step commands without difficulty. Cranial Nerves: II: Discs flat bilaterally; Visual fields grossly  normal, pupils equal, round, reactive to light and accommodation III,IV, VI: ptosis not present, extra-ocular motions intact bilaterally V,VII: smile symmetric, facial light touch sensation normal bilaterally VIII: hearing normal bilaterally IX,X: gag reflex present XI: bilateral shoulder shrug XII: midline tongue extension Motor: Right : Upper extremity   5/5    Left:     Upper extremity   5/5  Lower extremity   5/5     Lower extremity   5/5 Tone and bulk:normal tone throughout; no atrophy noted Sensory: Pinprick and light touch intact throughout, bilaterally Deep Tendon Reflexes: 2+ in the upper extremities and absent in the lower extremities Plantars: Right: mute   Left: mute Cerebellar: normal finger-to-nose, normal rapid alternating movements and normal heel-to-shin test Gait: not tested due to safety concerns   Laboratory Studies:   Basic Metabolic Panel:  Recent Labs Lab 06/06/17 2017  NA 137  K 3.9  CL 102  CO2 28  GLUCOSE 95  BUN 17  CREATININE 1.01  CALCIUM 9.1    Liver Function Tests: No results for input(s): AST, ALT, ALKPHOS, BILITOT, PROT, ALBUMIN in the last 168 hours. No results for input(s): LIPASE, AMYLASE in the last 168 hours. No results for input(s): AMMONIA in the last 168 hours.  CBC:  Recent Labs Lab 06/06/17 2017  WBC 9.8  HGB 17.0  HCT 48.2  MCV 87.3  PLT 212    Cardiac Enzymes: No results for input(s): CKTOTAL, CKMB, CKMBINDEX, TROPONINI in the last 168 hours.  BNP: Invalid input(s): POCBNP  CBG: No results for input(s): GLUCAP in the last 168 hours.  Microbiology: No results found for this or any previous visit.  Coagulation Studies: No results for input(s): LABPROT, INR in the last 72 hours.  Urinalysis:  Recent Labs Lab 06/06/17 2017  COLORURINE YELLOW*  LABSPEC 1.020  PHURINE 5.0  GLUCOSEU NEGATIVE  HGBUR NEGATIVE  BILIRUBINUR NEGATIVE  KETONESUR NEGATIVE  PROTEINUR NEGATIVE  NITRITE NEGATIVE  LEUKOCYTESUR  NEGATIVE    Lipid Panel:  No results found for: CHOL, TRIG, HDL, CHOLHDL, VLDL, LDLCALC  HgbA1C: No results found for: HGBA1C  Urine Drug Screen:     Component Value Date/Time   LABOPIA NONE DETECTED 06/06/2017 2017   COCAINSCRNUR NONE DETECTED 06/06/2017 2017   LABBENZ NONE DETECTED 06/06/2017 2017   AMPHETMU NONE DETECTED 06/06/2017 2017   THCU POSITIVE (A) 06/06/2017 2017   LABBARB NONE DETECTED 06/06/2017 2017    Alcohol Level: No results for input(s): ETH in the last 168 hours.  Other results: EKG: sinus rhythm at 74 bpm.  Imaging: Ct Angio Head W Or Wo Contrast  Result Date: 06/07/2017 CLINICAL DATA:  Initial evaluation for acute headache, syncope, the sensory changes. EXAM: CT ANGIOGRAPHY HEAD AND NECK TECHNIQUE: Multidetector CT imaging of the head and neck was performed using the standard protocol during bolus administration of intravenous contrast. Multiplanar CT image reconstructions and MIPs were obtained to evaluate the vascular anatomy. Carotid stenosis measurements (when applicable) are obtained utilizing NASCET criteria, using the distal internal carotid diameter as the denominator. CONTRAST:  75 cc of Isovue 370. COMPARISON:  Prior CT from 06/06/2017. FINDINGS: CTA NECK FINDINGS Aortic arch: Visualized aortic arch of normal caliber with normal branch pattern. No flow-limiting stenosis about the origin of the great vessels. Visualized subclavian artery is widely patent. Right carotid system: Right common carotid artery widely patent from its origin to the bifurcation. Scattered calcified noncalcified plaque about  the right bifurcation without flow-limiting stenosis. Right ICA widely patent distally to the skullbase without stenosis, dissection, or occlusion. Left carotid system: The centric noncalcified plaque present within knee mid-distal left common carotid artery without flow-limiting stenosis. Circumferential calcified noncalcified plaque about the left carotid  bifurcation/ proximal left ICA and without hemodynamically significant stenosis. Left ICA patent distally to the skullbase without stenosis, dissection, or occlusion. Vertebral arteries: Both of the vertebral arteries arise from the subclavian arteries. Right vertebral artery dominant. Vertebral artery's patent within the neck without stenosis, dissection, or occlusion. Skeleton: No acute osseus abnormality. No worrisome lytic or blastic osseous lesions. Other neck: No acute soft tissue abnormality within the neck. No adenopathy. Salivary glands within normal limits. Thyroid normal. Upper chest: Visualized upper chest within normal limits. Partially visualized lungs are clear. Review of the MIP images confirms the above findings CTA HEAD FINDINGS Anterior circulation: ICA widely patent through the termini without flow-limiting stenosis. ICA termini themselves are widely patent. A1 segments, anterior communicating artery, and anterior cerebral arteries widely patent without flow-limiting stenosis. M1 segments patent without stenosis or occlusion. MCA bifurcations normal. No proximal M2 occlusion. Distal MCA branches well perfused and symmetric. Posterior circulation: Vertebral arteries patent to the vertebrobasilar junction without stenosis. Right vertebral artery dominant. Posterior inferior cerebral arteries patent bilaterally. Basilar artery somewhat tortuous but widely patent to its distal aspect. Superior cerebral arteries patent bilaterally. Left PCA supplied via the basilar and is well perfused to its distal aspect without stenosis. Fetal type origin of the right PCA supplied via a widely patent right posterior communicating artery. Right PCA also widely patent to its distal aspect. Venous sinuses: Pain. Absent/hypoplastic straight sinus and vein of Galen, with the internal cerebral veins strained the a the straight sinus. Anatomic variants: Fetal type right PCA. No aneurysm vascular malformation. Delayed  phase: No pathologic enhancement. Review of the MIP images confirms the above findings IMPRESSION: 1. Negative CTA for large vessel occlusion. No high-grade or correctable stenosis within the major arterial vasculature of the head and neck. 2. Mild carotid artery bifurcation atheromatous disease without flow-limiting stenosis. 3. Widely patent vertebrobasilar system. Electronically Signed   By: Rise Mu M.D.   On: 06/07/2017 13:54   Ct Head Wo Contrast  Result Date: 06/06/2017 CLINICAL DATA:  Intermittent episodes of dizziness past 10 days with syncopal episode tonight and loss of consciousness less than 1 minute. EXAM: CT HEAD WITHOUT CONTRAST CT CERVICAL SPINE WITHOUT CONTRAST TECHNIQUE: Multidetector CT imaging of the head and cervical spine was performed following the standard protocol without intravenous contrast. Multiplanar CT image reconstructions of the cervical spine were also generated. COMPARISON:  Head CT 10/31/2012, 07/01/2008 and cervical spine CT 10/31/2012 FINDINGS: CT HEAD FINDINGS Brain: The ventricles, cisterns and other CSF spaces are within normal. Septum pellucidum difficult to visualize although better visualized on prior exams. No mass, mass effect, shift of midline structures or acute hemorrhage. No evidence of acute infarction. Vascular: No hyperdense vessel or unexpected calcification. Skull: Normal. Negative for fracture or focal lesion. Sinuses/Orbits: No acute finding. Other: None. CT CERVICAL SPINE FINDINGS Alignment: Normal. Skull base and vertebrae: There is mild spondylosis throughout the cervical spine. Vertebral body heights are maintained. C1-2 articulation is within normal. Minimal uncovertebral joint spurring is present as well as mild facet arthropathy. No acute fracture or subluxation. Moderate bilateral neural foramina narrowing at the C6-7 level. Soft tissues and spinal canal: No prevertebral fluid or swelling. No visible canal hematoma. Disc levels:  Mild  disc space narrowing  at the C6-7 level. Upper chest: Negative. Other: None. IMPRESSION: No acute intracranial findings. No acute cervical spine injury. Mild spondylosis throughout the cervical spine with mild disc disease at the C6-7 level in addition to moderate bilateral neural foraminal narrowing at the C6-7 level. Electronically Signed   By: Elberta Fortis M.D.   On: 06/06/2017 21:00   Ct Angio Neck W Or Wo Contrast  Result Date: 06/07/2017 CLINICAL DATA:  Initial evaluation for acute headache, syncope, the sensory changes. EXAM: CT ANGIOGRAPHY HEAD AND NECK TECHNIQUE: Multidetector CT imaging of the head and neck was performed using the standard protocol during bolus administration of intravenous contrast. Multiplanar CT image reconstructions and MIPs were obtained to evaluate the vascular anatomy. Carotid stenosis measurements (when applicable) are obtained utilizing NASCET criteria, using the distal internal carotid diameter as the denominator. CONTRAST:  75 cc of Isovue 370. COMPARISON:  Prior CT from 06/06/2017. FINDINGS: CTA NECK FINDINGS Aortic arch: Visualized aortic arch of normal caliber with normal branch pattern. No flow-limiting stenosis about the origin of the great vessels. Visualized subclavian artery is widely patent. Right carotid system: Right common carotid artery widely patent from its origin to the bifurcation. Scattered calcified noncalcified plaque about the right bifurcation without flow-limiting stenosis. Right ICA widely patent distally to the skullbase without stenosis, dissection, or occlusion. Left carotid system: The centric noncalcified plaque present within knee mid-distal left common carotid artery without flow-limiting stenosis. Circumferential calcified noncalcified plaque about the left carotid bifurcation/ proximal left ICA and without hemodynamically significant stenosis. Left ICA patent distally to the skullbase without stenosis, dissection, or occlusion. Vertebral  arteries: Both of the vertebral arteries arise from the subclavian arteries. Right vertebral artery dominant. Vertebral artery's patent within the neck without stenosis, dissection, or occlusion. Skeleton: No acute osseus abnormality. No worrisome lytic or blastic osseous lesions. Other neck: No acute soft tissue abnormality within the neck. No adenopathy. Salivary glands within normal limits. Thyroid normal. Upper chest: Visualized upper chest within normal limits. Partially visualized lungs are clear. Review of the MIP images confirms the above findings CTA HEAD FINDINGS Anterior circulation: ICA widely patent through the termini without flow-limiting stenosis. ICA termini themselves are widely patent. A1 segments, anterior communicating artery, and anterior cerebral arteries widely patent without flow-limiting stenosis. M1 segments patent without stenosis or occlusion. MCA bifurcations normal. No proximal M2 occlusion. Distal MCA branches well perfused and symmetric. Posterior circulation: Vertebral arteries patent to the vertebrobasilar junction without stenosis. Right vertebral artery dominant. Posterior inferior cerebral arteries patent bilaterally. Basilar artery somewhat tortuous but widely patent to its distal aspect. Superior cerebral arteries patent bilaterally. Left PCA supplied via the basilar and is well perfused to its distal aspect without stenosis. Fetal type origin of the right PCA supplied via a widely patent right posterior communicating artery. Right PCA also widely patent to its distal aspect. Venous sinuses: Pain. Absent/hypoplastic straight sinus and vein of Galen, with the internal cerebral veins strained the a the straight sinus. Anatomic variants: Fetal type right PCA. No aneurysm vascular malformation. Delayed phase: No pathologic enhancement. Review of the MIP images confirms the above findings IMPRESSION: 1. Negative CTA for large vessel occlusion. No high-grade or correctable stenosis  within the major arterial vasculature of the head and neck. 2. Mild carotid artery bifurcation atheromatous disease without flow-limiting stenosis. 3. Widely patent vertebrobasilar system. Electronically Signed   By: Rise Mu M.D.   On: 06/07/2017 13:54   Ct Cervical Spine Wo Contrast  Result Date: 06/06/2017 CLINICAL DATA:  Intermittent episodes of dizziness past 10 days with syncopal episode tonight and loss of consciousness less than 1 minute. EXAM: CT HEAD WITHOUT CONTRAST CT CERVICAL SPINE WITHOUT CONTRAST TECHNIQUE: Multidetector CT imaging of the head and cervical spine was performed following the standard protocol without intravenous contrast. Multiplanar CT image reconstructions of the cervical spine were also generated. COMPARISON:  Head CT 10/31/2012, 07/01/2008 and cervical spine CT 10/31/2012 FINDINGS: CT HEAD FINDINGS Brain: The ventricles, cisterns and other CSF spaces are within normal. Septum pellucidum difficult to visualize although better visualized on prior exams. No mass, mass effect, shift of midline structures or acute hemorrhage. No evidence of acute infarction. Vascular: No hyperdense vessel or unexpected calcification. Skull: Normal. Negative for fracture or focal lesion. Sinuses/Orbits: No acute finding. Other: None. CT CERVICAL SPINE FINDINGS Alignment: Normal. Skull base and vertebrae: There is mild spondylosis throughout the cervical spine. Vertebral body heights are maintained. C1-2 articulation is within normal. Minimal uncovertebral joint spurring is present as well as mild facet arthropathy. No acute fracture or subluxation. Moderate bilateral neural foramina narrowing at the C6-7 level. Soft tissues and spinal canal: No prevertebral fluid or swelling. No visible canal hematoma. Disc levels:  Mild disc space narrowing at the C6-7 level. Upper chest: Negative. Other: None. IMPRESSION: No acute intracranial findings. No acute cervical spine injury. Mild spondylosis  throughout the cervical spine with mild disc disease at the C6-7 level in addition to moderate bilateral neural foraminal narrowing at the C6-7 level. Electronically Signed   By: Elberta Fortis M.D.   On: 06/06/2017 21:00     Assessment/Plan: 46 year old healthy male who presented after a syncopal episode with numbness and weakness of his entire body afterward.  Patient now at baseline.  Etiology unclear.  Head CT reviewed and shows no acute changes.  CT of the cervical spine shows no cord compromise.  CTA ordered and unremarkable.  EEG unremarkable.  With premonitory symptoms of dizziness, further work up recommended.   Recommendations: 1.  TSH, B12 2.  Telemetry monitoring 3.  Echocardiogram 4.  Orthostatic vitals   Thana Farr, MD Neurology [phone] 06/07/2017, 2:31 PM

## 2017-06-07 NOTE — Evaluation (Signed)
Physical Therapy Evaluation Patient Details Name: Bruce Kane MRN: [id number] DOB: [birth year] Today's Date: 06/07/2017   History of Present Illness  Pt is a 46 y.o. male presenting to hospital s/p syncopal episode and then unable to move or feel his body from neck down.  Pt reporting it took a couple hours for symptom's to start to resolve.  No significant PMH.  Clinical Impression  Pt demonstrates steady safe independent functional mobility (including ambulation without AD and navigating stairs).  B UE and B LE strength, light touch, coordination, and tone WNL.  B LE proprioception WNL.  Formal balance assessment performed (see below for details) and no balance impairments noted.  Pt appears safe to discharge home when medically appropriate; no acute PT needs identified.  Will complete current PT order and discharge pt from PT in house.    Follow Up Recommendations No PT follow up    Equipment Recommendations  None recommended by PT    Recommendations for Other Services       Precautions / Restrictions Precautions Precautions: None      Mobility  Bed Mobility Overal bed mobility: Modified Independent             General bed mobility comments: Supine to/from sit with HOB elevated without any difficulties.  Transfers Overall transfer level: Independent Equipment used: None             General transfer comment: steady strong transfers without any difficulty  Ambulation/Gait Ambulation/Gait assistance: Independent Ambulation Distance (Feet): 300 Feet Assistive device: None Gait Pattern/deviations: WFL(Within Functional Limits)   Gait velocity interpretation: at or above normal speed for age/gender General Gait Details: steady gait; no abnormalities noted  Stairs Stairs: Yes Stairs assistance: Independent Stair Management: No rails;Alternating pattern;Forwards Number of Stairs: 3 General stair comments: steady safe navigation of stairs  Wheelchair  Mobility    Modified Rankin (Stroke Patients Only)       Balance Overall balance assessment: Independent                               Standardized Balance Assessment Standardized Balance Assessment : Dynamic Gait Index   Dynamic Gait Index Level Surface: Normal Change in Gait Speed: Normal Gait with Horizontal Head Turns: Normal Gait with Vertical Head Turns: Normal Gait and Pivot Turn: Normal Step Over Obstacle: Normal Step Around Obstacles: Normal Steps: Normal Total Score: 24       Pertinent Vitals/Pain Pain Assessment: No/denies pain  Vitals (HR and O2 on room air) stable and WFL throughout treatment session.    Home Living Family/patient expects to be discharged to:: Private residence Living Arrangements: Spouse/significant other Available Help at Discharge: Family Type of Home: House Home Access: Stairs to enter Entrance Stairs-Rails: None Entrance Stairs-Number of Steps: 1 Home Layout: One level Home Equipment: None      Prior Function Level of Independence: Independent         Comments: Pt works full time (active job).  Denies any falls in past 6 months.     Hand Dominance        Extremity/Trunk Assessment   Upper Extremity Assessment Upper Extremity Assessment: Overall WFL for tasks assessed    Lower Extremity Assessment Lower Extremity Assessment: Overall WFL for tasks assessed    Cervical / Trunk Assessment Cervical / Trunk Assessment: Normal  Communication   Communication: No difficulties  Cognition Arousal/Alertness: Awake/alert Behavior During Therapy: WFL for tasks assessed/performed Overall Cognitive Status:  Within Functional Limits for tasks assessed                                        General Comments  Pt's wife present during session.    Exercises     Assessment/Plan    PT Assessment Patent does not need any further PT services  PT Problem List         PT Treatment  Interventions      PT Goals (Current goals can be found in the Care Plan section)  Acute Rehab PT Goals Patient Stated Goal: to go home PT Goal Formulation: With patient Time For Goal Achievement: 06/21/17 Potential to Achieve Goals: Good    Frequency     Barriers to discharge        Co-evaluation               AM-PAC PT "6 Clicks" Daily Activity  Outcome Measure Difficulty turning over in bed (including adjusting bedclothes, sheets and blankets)?: None Difficulty moving from lying on back to sitting on the side of the bed? : None Difficulty sitting down on and standing up from a chair with arms (e.g., wheelchair, bedside commode, etc,.)?: None Help needed moving to and from a bed to chair (including a wheelchair)?: None Help needed walking in hospital room?: None Help needed climbing 3-5 steps with a railing? : None 6 Click Score: 24    End of Session Equipment Utilized During Treatment: Gait belt Activity Tolerance: Patient tolerated treatment well Patient left: in bed;with call bell/phone within reach;with family/visitor present Nurse Communication: Mobility status;Precautions PT Visit Diagnosis: Other symptoms and signs involving the nervous system (R29.898)    Time: 0981-1914 PT Time Calculation (min) (ACUTE ONLY): 20 min   Charges:   PT Evaluation $PT Eval Low Complexity: 1 Low     PT G Codes:   PT G-Codes **NOT FOR INPATIENT CLASS** Functional Assessment Tool Used: AM-PAC 6 Clicks Basic Mobility Functional Limitation: Mobility: Walking and moving around Mobility: Walking and Moving Around Current Status (N8295): 0 percent impaired, limited or restricted Mobility: Walking and Moving Around Goal Status (A2130): 0 percent impaired, limited or restricted Mobility: Walking and Moving Around Discharge Status (Q6578): 0 percent impaired, limited or restricted    Hendricks Limes, PT 06/07/17, 2:35 PM [phone]

## 2017-06-07 NOTE — Progress Notes (Signed)
Patient ambulated with nurse around nurses station per MD order. Gait steady, no issues noted.

## 2017-06-07 NOTE — Progress Notes (Signed)
Patient is refusing telemetry monitoring. Patient educated. Bo Mcclintock, RN

## 2017-06-07 NOTE — ED Notes (Signed)
Pt states he would like a nicotine patch. Pt ate dinner brought by wife and tolerated well. Pt denies pain, or nausea.

## 2017-06-07 NOTE — Progress Notes (Signed)
1        [facility] - [place] at [facility]   PATIENT NAME: Bruce Kane    MR#:  [id number]  DATE OF BIRTH:  1971/08/03  SUBJECTIVE:  CHIEF COMPLAINT:   Chief Complaint  Patient presents with  . Loss of Consciousness  Feels back to normal, somewhat sleepy.  Wife at bedside reporting just returned from [facility] after about 1 week of trip REVIEW OF SYSTEMS:  Review of Systems  Constitutional: Negative for chills, fever and weight loss.  HENT: Negative for nosebleeds and sore throat.   Eyes: Negative for blurred vision.  Respiratory: Negative for cough, shortness of breath and wheezing.   Cardiovascular: Negative for chest pain, orthopnea, leg swelling and PND.  Gastrointestinal: Negative for abdominal pain, constipation, diarrhea, heartburn, nausea and vomiting.  Genitourinary: Negative for dysuria and urgency.  Musculoskeletal: Negative for back pain.  Skin: Negative for rash.  Neurological: Negative for dizziness, speech change, focal weakness and headaches.  Endo/Heme/Allergies: Does not bruise/bleed easily.  Psychiatric/Behavioral: Negative for depression.    DRUG ALLERGIES:  No Known Allergies VITALS:  Blood pressure 129/76, pulse (!) 57, temperature 97.7 F (36.5 C), temperature source Oral, resp. rate 20, height  (1.702 m), weight 78 kg (172 lb), SpO2 96 %. PHYSICAL EXAMINATION:  Physical Exam  Constitutional: He is oriented to person, place, and time and well-developed, well-nourished, and in no distress.  HENT:  Head: Normocephalic and atraumatic.  Eyes: Pupils are equal, round, and reactive to light. Conjunctivae and EOM are normal.  Neck: Normal range of motion. Neck supple. No tracheal deviation present. No thyromegaly present.  Cardiovascular: Normal rate, regular rhythm and normal heart sounds.   Pulmonary/Chest: Effort normal and breath sounds normal. No respiratory distress. He has no wheezes. He exhibits no tenderness.    Abdominal: Soft. Bowel sounds are normal. He exhibits no distension. There is no tenderness.  Musculoskeletal: Normal range of motion.  Neurological: He is alert and oriented to person, place, and time. No cranial nerve deficit.  Skin: Skin is warm and dry. No rash noted.  Psychiatric: Mood and affect normal.   LABORATORY PANEL:  Male CBC  Recent Labs Lab 06/06/17 2017  WBC 9.8  HGB 17.0  HCT 48.2  PLT 212   ------------------------------------------------------------------------------------------------------------------ Chemistries   Recent Labs Lab 06/06/17 2017  NA 137  K 3.9  CL 102  CO2 28  GLUCOSE 95  BUN 17  CREATININE 1.01  CALCIUM 9.1   RADIOLOGY:  Ct Angio Head W Or Wo Contrast  Result Date: 06/07/2017 CLINICAL DATA:  Initial evaluation for acute headache, syncope, the sensory changes. EXAM: CT ANGIOGRAPHY HEAD AND NECK TECHNIQUE: Multidetector CT imaging of the head and neck was performed using the standard protocol during bolus administration of intravenous contrast. Multiplanar CT image reconstructions and MIPs were obtained to evaluate the vascular anatomy. Carotid stenosis measurements (when applicable) are obtained utilizing NASCET criteria, using the distal internal carotid diameter as the denominator. CONTRAST:  75 cc of Isovue 370. COMPARISON:  Prior CT from 06/06/2017. FINDINGS: CTA NECK FINDINGS Aortic arch: Visualized aortic arch of normal caliber with normal branch pattern. No flow-limiting stenosis about the origin of the great vessels. Visualized subclavian artery is widely patent. Right carotid system: Right common carotid artery widely patent from its origin to the bifurcation. Scattered calcified noncalcified plaque about the right bifurcation without flow-limiting stenosis. Right ICA widely patent distally to the skullbase without stenosis, dissection, or occlusion. Left carotid system: The centric  noncalcified plaque present within knee mid-distal  left common carotid artery without flow-limiting stenosis. Circumferential calcified noncalcified plaque about the left carotid bifurcation/ proximal left ICA and without hemodynamically significant stenosis. Left ICA patent distally to the skullbase without stenosis, dissection, or occlusion. Vertebral arteries: Both of the vertebral arteries arise from the subclavian arteries. Right vertebral artery dominant. Vertebral artery's patent within the neck without stenosis, dissection, or occlusion. Skeleton: No acute osseus abnormality. No worrisome lytic or blastic osseous lesions. Other neck: No acute soft tissue abnormality within the neck. No adenopathy. Salivary glands within normal limits. Thyroid normal. Upper chest: Visualized upper chest within normal limits. Partially visualized lungs are clear. Review of the MIP images confirms the above findings CTA HEAD FINDINGS Anterior circulation: ICA widely patent through the termini without flow-limiting stenosis. ICA termini themselves are widely patent. A1 segments, anterior communicating artery, and anterior cerebral arteries widely patent without flow-limiting stenosis. M1 segments patent without stenosis or occlusion. MCA bifurcations normal. No proximal M2 occlusion. Distal MCA branches well perfused and symmetric. Posterior circulation: Vertebral arteries patent to the vertebrobasilar junction without stenosis. Right vertebral artery dominant. Posterior inferior cerebral arteries patent bilaterally. Basilar artery somewhat tortuous but widely patent to its distal aspect. Superior cerebral arteries patent bilaterally. Left PCA supplied via the basilar and is well perfused to its distal aspect without stenosis. Fetal type origin of the right PCA supplied via a widely patent right posterior communicating artery. Right PCA also widely patent to its distal aspect. Venous sinuses: Pain. Absent/hypoplastic straight sinus and vein of Galen, with the internal cerebral  veins strained the a the straight sinus. Anatomic variants: Fetal type right PCA. No aneurysm vascular malformation. Delayed phase: No pathologic enhancement. Review of the MIP images confirms the above findings IMPRESSION: 1. Negative CTA for large vessel occlusion. No high-grade or correctable stenosis within the major arterial vasculature of the head and neck. 2. Mild carotid artery bifurcation atheromatous disease without flow-limiting stenosis. 3. Widely patent vertebrobasilar system. Electronically Signed   By: Rise Mu M.D.   On: 06/07/2017 13:54   Ct Head Wo Contrast  Result Date: 06/06/2017 CLINICAL DATA:  Intermittent episodes of dizziness past 10 days with syncopal episode tonight and loss of consciousness less than 1 minute. EXAM: CT HEAD WITHOUT CONTRAST CT CERVICAL SPINE WITHOUT CONTRAST TECHNIQUE: Multidetector CT imaging of the head and cervical spine was performed following the standard protocol without intravenous contrast. Multiplanar CT image reconstructions of the cervical spine were also generated. COMPARISON:  Head CT 10/31/2012, 07/01/2008 and cervical spine CT 10/31/2012 FINDINGS: CT HEAD FINDINGS Brain: The ventricles, cisterns and other CSF spaces are within normal. Septum pellucidum difficult to visualize although better visualized on prior exams. No mass, mass effect, shift of midline structures or acute hemorrhage. No evidence of acute infarction. Vascular: No hyperdense vessel or unexpected calcification. Skull: Normal. Negative for fracture or focal lesion. Sinuses/Orbits: No acute finding. Other: None. CT CERVICAL SPINE FINDINGS Alignment: Normal. Skull base and vertebrae: There is mild spondylosis throughout the cervical spine. Vertebral body heights are maintained. C1-2 articulation is within normal. Minimal uncovertebral joint spurring is present as well as mild facet arthropathy. No acute fracture or subluxation. Moderate bilateral neural foramina narrowing at the  C6-7 level. Soft tissues and spinal canal: No prevertebral fluid or swelling. No visible canal hematoma. Disc levels:  Mild disc space narrowing at the C6-7 level. Upper chest: Negative. Other: None. IMPRESSION: No acute intracranial findings. No acute cervical spine injury. Mild spondylosis throughout the cervical  spine with mild disc disease at the C6-7 level in addition to moderate bilateral neural foraminal narrowing at the C6-7 level. Electronically Signed   By: Elberta Fortis M.D.   On: 06/06/2017 21:00   Ct Angio Neck W Or Wo Contrast  Result Date: 06/07/2017 CLINICAL DATA:  Initial evaluation for acute headache, syncope, the sensory changes. EXAM: CT ANGIOGRAPHY HEAD AND NECK TECHNIQUE: Multidetector CT imaging of the head and neck was performed using the standard protocol during bolus administration of intravenous contrast. Multiplanar CT image reconstructions and MIPs were obtained to evaluate the vascular anatomy. Carotid stenosis measurements (when applicable) are obtained utilizing NASCET criteria, using the distal internal carotid diameter as the denominator. CONTRAST:  75 cc of Isovue 370. COMPARISON:  Prior CT from 06/06/2017. FINDINGS: CTA NECK FINDINGS Aortic arch: Visualized aortic arch of normal caliber with normal branch pattern. No flow-limiting stenosis about the origin of the great vessels. Visualized subclavian artery is widely patent. Right carotid system: Right common carotid artery widely patent from its origin to the bifurcation. Scattered calcified noncalcified plaque about the right bifurcation without flow-limiting stenosis. Right ICA widely patent distally to the skullbase without stenosis, dissection, or occlusion. Left carotid system: The centric noncalcified plaque present within knee mid-distal left common carotid artery without flow-limiting stenosis. Circumferential calcified noncalcified plaque about the left carotid bifurcation/ proximal left ICA and without  hemodynamically significant stenosis. Left ICA patent distally to the skullbase without stenosis, dissection, or occlusion. Vertebral arteries: Both of the vertebral arteries arise from the subclavian arteries. Right vertebral artery dominant. Vertebral artery's patent within the neck without stenosis, dissection, or occlusion. Skeleton: No acute osseus abnormality. No worrisome lytic or blastic osseous lesions. Other neck: No acute soft tissue abnormality within the neck. No adenopathy. Salivary glands within normal limits. Thyroid normal. Upper chest: Visualized upper chest within normal limits. Partially visualized lungs are clear. Review of the MIP images confirms the above findings CTA HEAD FINDINGS Anterior circulation: ICA widely patent through the termini without flow-limiting stenosis. ICA termini themselves are widely patent. A1 segments, anterior communicating artery, and anterior cerebral arteries widely patent without flow-limiting stenosis. M1 segments patent without stenosis or occlusion. MCA bifurcations normal. No proximal M2 occlusion. Distal MCA branches well perfused and symmetric. Posterior circulation: Vertebral arteries patent to the vertebrobasilar junction without stenosis. Right vertebral artery dominant. Posterior inferior cerebral arteries patent bilaterally. Basilar artery somewhat tortuous but widely patent to its distal aspect. Superior cerebral arteries patent bilaterally. Left PCA supplied via the basilar and is well perfused to its distal aspect without stenosis. Fetal type origin of the right PCA supplied via a widely patent right posterior communicating artery. Right PCA also widely patent to its distal aspect. Venous sinuses: Pain. Absent/hypoplastic straight sinus and vein of Galen, with the internal cerebral veins strained the a the straight sinus. Anatomic variants: Fetal type right PCA. No aneurysm vascular malformation. Delayed phase: No pathologic enhancement. Review of the  MIP images confirms the above findings IMPRESSION: 1. Negative CTA for large vessel occlusion. No high-grade or correctable stenosis within the major arterial vasculature of the head and neck. 2. Mild carotid artery bifurcation atheromatous disease without flow-limiting stenosis. 3. Widely patent vertebrobasilar system. Electronically Signed   By: Rise Mu M.D.   On: 06/07/2017 13:54   Ct Cervical Spine Wo Contrast  Result Date: 06/06/2017 CLINICAL DATA:  Intermittent episodes of dizziness past 10 days with syncopal episode tonight and loss of consciousness less than 1 minute. EXAM: CT HEAD WITHOUT CONTRAST  CT CERVICAL SPINE WITHOUT CONTRAST TECHNIQUE: Multidetector CT imaging of the head and cervical spine was performed following the standard protocol without intravenous contrast. Multiplanar CT image reconstructions of the cervical spine were also generated. COMPARISON:  Head CT 10/31/2012, 07/01/2008 and cervical spine CT 10/31/2012 FINDINGS: CT HEAD FINDINGS Brain: The ventricles, cisterns and other CSF spaces are within normal. Septum pellucidum difficult to visualize although better visualized on prior exams. No mass, mass effect, shift of midline structures or acute hemorrhage. No evidence of acute infarction. Vascular: No hyperdense vessel or unexpected calcification. Skull: Normal. Negative for fracture or focal lesion. Sinuses/Orbits: No acute finding. Other: None. CT CERVICAL SPINE FINDINGS Alignment: Normal. Skull base and vertebrae: There is mild spondylosis throughout the cervical spine. Vertebral body heights are maintained. C1-2 articulation is within normal. Minimal uncovertebral joint spurring is present as well as mild facet arthropathy. No acute fracture or subluxation. Moderate bilateral neural foramina narrowing at the C6-7 level. Soft tissues and spinal canal: No prevertebral fluid or swelling. No visible canal hematoma. Disc levels:  Mild disc space narrowing at the C6-7  level. Upper chest: Negative. Other: None. IMPRESSION: No acute intracranial findings. No acute cervical spine injury. Mild spondylosis throughout the cervical spine with mild disc disease at the C6-7 level in addition to moderate bilateral neural foraminal narrowing at the C6-7 level. Electronically Signed   By: Elberta Fortis M.D.   On: 06/06/2017 21:00   ASSESSMENT AND PLAN:  46 y.o. male who presents with An episode including loss of consciousness tonight followed by temporary paralysis from the neck down as well as sensory changes. Patient states he is feeling somewhat dizzy earlier today, and around dinnertime felt like he was going to pass out. He did lose consciousness. He woke up after a short while, and was unable to move from the neck down. Here in the ED on exam he was found to have some sensory deficit by the ED physician as well. However, the symptoms resolved. Hospitals were called for admission and further evaluation  * Loss of consciousness (HCC) - unclear etiology, but given his story and presentation there is possibility for something like Todd's paralysis, see below.  -Obtain 2D echo -CT angios head and neck -TSH, orthostatic vitals, vitamin B12 levels, telemetry monitoring -Neurology consult  *Smoking -Smoking cessation counseling performed for 4 minutes -Nicotine patch ordered   All the records are reviewed and case discussed with Care Management/Social Worker. Management plans discussed with the patient, family (wife at bedside) and they are in agreement.  CODE STATUS: Full Code  TOTAL TIME TAKING CARE OF THIS PATIENT: 35 minutes.   More than 50% of the time was spent in counseling/coordination of care: YES  POSSIBLE D/C IN 1 DAYS, DEPENDING ON CLINICAL CONDITION.  And neurology evaluation   [name] M.D on 06/07/2017 at 4:37 PM  Between 7am to 6pm - Pager - [phone]  After 6pm go to [email or web address] - Social research officer, government  [facility] [place]  [facility]  Office  [phone]  CC: Primary care physician; Patient, No Pcp Per  Note: This dictation was prepared with Dragon dictation along with smaller phrase technology. Any transcriptional errors that result from this process are unintentional.

## 2017-06-07 NOTE — Care Management Note (Signed)
Case Management Note  Patient Details  Name: ZAXTON ANGERER MRN: [id number] Date of Birth: [birth year]  Subjective/Objective:     Admitted to [facility] with the diagnosis of loss of consciousness under observation status. Lives with wife, Babette Relic, [phone]). No primary care physician. Private insurance. Works a job. Takes care of all basic and instrumental activities of  Daily living himself, drives.               Action/Plan: Information on physician's accepting new patients given.    Expected Discharge Date:                  Expected Discharge Plan:     In-House Referral:     Discharge planning Services     Post Acute Care Choice:    Choice offered to:     DME Arranged:    DME Agency:     HH Arranged:    HH Agency:     Status of Service:     If discussed at [employer], dates discussed:    Additional Comments:  Gwenette Greet, RN MSN CCM Care Management [phone] 06/07/2017, 2:18 PM

## 2017-06-08 ENCOUNTER — Observation Stay (HOSPITAL_BASED_OUTPATIENT_CLINIC_OR_DEPARTMENT_OTHER)
Admit: 2017-06-08 | Discharge: 2017-06-08 | Disposition: A | Discharge status: Home / Self Care | Payer: BLUE CROSS/BLUE SHIELD | Attending: Neurology | Admitting: Neurology

## 2017-06-08 DIAGNOSIS — I361 Nonrheumatic tricuspid (valve) insufficiency: Secondary | ICD-10-CM

## 2017-06-08 LAB — HIV ANTIBODY (ROUTINE TESTING W REFLEX): HIV Screen 4th Generation wRfx: NONREACTIVE

## 2017-06-08 MED ORDER — CYANOCOBALAMIN 1000 MCG/ML IJ SOLN
1000.0000 ug | Freq: Once | INTRAMUSCULAR | Status: AC
  Administered 2017-06-08: 1000 ug via INTRAMUSCULAR
  Filled 2017-06-08: qty 1

## 2017-06-08 NOTE — Progress Notes (Signed)
*  PRELIMINARY RESULTS* Echocardiogram 2D Echocardiogram has been performed.  Cristela Blue 06/08/2017, 9:29 AM

## 2017-06-08 NOTE — Progress Notes (Signed)
Discussed discharge instructions and medications with patient. IV removed. All questions addressed. Note for work given to patient and his wife.  Made patient aware of follow up appointment. Patient transported home via car by his wife.  Orson Ape, RN

## 2017-06-08 NOTE — Progress Notes (Signed)
Pt continues to refuse telemetry monitoring.

## 2017-06-08 NOTE — Progress Notes (Signed)
[facility] - [place] at [facility]        Bruce Kane was admitted to the Hospital on 06/06/2017 and Discharged  06/08/2017 and his wife was also here so both should be excused from work for 3 days starting 06/06/2017 , may return to work without any restrictions 06/09/2017.  [name] M.D on 06/08/2017,at 10:34 AM  [facility] - [place] at [facility]  [phone]

## 2017-06-08 NOTE — Discharge Instructions (Signed)
Near-Syncope °Near-syncope is when you suddenly get weak or dizzy, or you feel like you might pass out (faint). During an episode of near-syncope, you may: °· Feel dizzy or light-headed. °· Feel sick to your stomach (nauseous). °· See all white or all black. °· Have cold, clammy skin. ° °If you passed out, get help right away.Call your local emergency services (911 in the [place] Do not drive yourself to the hospital. °Follow these instructions at home: °Pay attention to any changes in your symptoms. Take these actions to help with your condition: °· Have someone stay with you until you feel stable. °· Do not drive, use machinery, or play sports until your doctor says it is okay. °· Keep all follow-up visits as told by your doctor. This is important. °· If you start to feel like you might pass out, lie down right away and raise (elevate) your feet above the level of your heart. Breathe deeply and steadily. Wait until all of the symptoms are gone. °· Drink enough fluid to keep your pee (urine) clear or pale yellow. °· If you are taking blood pressure or heart medicine, get up slowly and spend many minutes getting ready to sit and then stand. This can help with dizziness. °· Take over-the-counter and prescription medicines only as told by your doctor. ° °Get help right away if: °· You have a very bad headache. °· You have unusual pain in your chest, tummy, or back. °· You are bleeding from your mouth or rectum. °· You have black or tarry poop (stool). °· You have a very fast or uneven heartbeat (palpitations). °· You pass out one time or more than once. °· You have jerky movements that you cannot control (seizure). °· You are confused. °· You have trouble walking. °· You are very weak. °· You have vision problems. °These symptoms may be an emergency. Do not wait to see if the symptoms will go away. Get medical help right away. Call your local emergency services (911 in the [place] Do not drive yourself to the  hospital. °This information is not intended to replace advice given to you by your health care provider. Make sure you discuss any questions you have with your health care provider. °Document Released: 02/07/2008 Document Revised: 01/27/2016 Document Reviewed: 05/05/2015 °Elsevier Interactive Patient Education © 2017 [employer] ° °

## 2017-06-11 NOTE — Discharge Summary (Signed)
[facility] [place] at [facility]   PATIENT NAME: Bruce Kane    MR#:  [id number]  DATE OF BIRTH:  03-08-1971  DATE OF ADMISSION:  06/06/2017   ADMITTING PHYSICIAN: Oralia Manis, MD  DATE OF DISCHARGE: 06/08/2017 11:34 AM  PRIMARY CARE PHYSICIAN: Patient, No Pcp Per   ADMISSION DIAGNOSIS:  Paralysis (HCC) [G83.9] Syncope, unspecified syncope type [R55] DISCHARGE DIAGNOSIS:  Principal Problem:   Syncope Active Problems:   Paralysis (HCC)  SECONDARY DIAGNOSIS:   Past Medical History:  Diagnosis Date  . Patient denies medical problems    HOSPITAL COURSE:  46 y.o.malewho presents with An episode including loss of consciousness tonight followed by temporary paralysis from the neck down as well as sensory changes. Patient states he is feeling somewhat dizzy earlier today, and around dinnertime felt like he was going to pass out. He did lose consciousness. He woke up after a short while, and was unable to move from the neck down.   * Loss of consciousness (HCC)  - He is now at baseline.   - Etiology unclear.  Head CT shows no acute changes.  CT of the cervical spine shows no cord compromise.  CTA ordered and unremarkable.  EEG unremarkable.   - echo wnl - urine Tox + for marijuana  *Smoking -Smoking cessation counseling performed for 4 minutes  DISCHARGE CONDITIONS:  stable CONSULTS OBTAINED:  Treatment Team:  Thana Farr, MD DRUG ALLERGIES:  No Known Allergies DISCHARGE MEDICATIONS:   Allergies as of 06/08/2017   No Known Allergies     Medication List    TAKE these medications   HYDROcodone-acetaminophen 5-325 MG tablet Commonly known as:  NORCO/VICODIN Take 1 tablet by mouth every 6 (six) hours as needed for moderate pain.      DISCHARGE INSTRUCTIONS:   DIET:  Regular diet DISCHARGE CONDITION:  Good ACTIVITY:  Activity as tolerated OXYGEN:  Home Oxygen: No.  Oxygen Delivery: room air DISCHARGE LOCATION:  home    If you experience worsening of your admission symptoms, develop shortness of breath, life threatening emergency, suicidal or homicidal thoughts you must seek medical attention immediately by calling 911 or calling your MD immediately  if symptoms less severe.  You Must read complete instructions/literature along with all the possible adverse reactions/side effects for all the Medicines you take and that have been prescribed to you. Take any new Medicines after you have completely understood and accpet all the possible adverse reactions/side effects.   Please note  You were cared for by a hospitalist during your hospital stay. If you have any questions about your discharge medications or the care you received while you were in the hospital after you are discharged, you can call the unit and asked to speak with the hospitalist on call if the hospitalist that took care of you is not available. Once you are discharged, your primary care physician will handle any further medical issues. Please note that NO REFILLS for any discharge medications will be authorized once you are discharged, as it is imperative that you return to your primary care physician (or establish a relationship with a primary care physician if you do not have one) for your aftercare needs so that they can reassess your need for medications and monitor your lab values.    On the day of Discharge:  VITAL SIGNS:  Blood pressure 133/82, pulse (!) 58, temperature 97.7 F (36.5 C), temperature source Oral, resp. rate 13, height  (1.702 m), weight 78  kg (172 lb), SpO2 95 %. PHYSICAL EXAMINATION:  GENERAL:  46 y.o.-year-old patient lying in the bed with no acute distress.  EYES: Pupils equal, round, reactive to light and accommodation. No scleral icterus. Extraocular muscles intact.  HEENT: Head atraumatic, normocephalic. Oropharynx and nasopharynx clear.  NECK:  Supple, no jugular venous distention. No thyroid enlargement, no  tenderness.  LUNGS: Normal breath sounds bilaterally, no wheezing, rales,rhonchi or crepitation. No use of accessory muscles of respiration.  CARDIOVASCULAR: S1, S2 normal. No murmurs, rubs, or gallops.  ABDOMEN: Soft, non-tender, non-distended. Bowel sounds present. No organomegaly or mass.  EXTREMITIES: No pedal edema, cyanosis, or clubbing.  NEUROLOGIC: Cranial nerves II through XII are intact. Muscle strength 5/5 in all extremities. Sensation intact. Gait not checked.  PSYCHIATRIC: The patient is alert and oriented x 3.  SKIN: No obvious rash, lesion, or ulcer.  DATA REVIEW:   CBC  Recent Labs Lab 06/06/17 2017  WBC 9.8  HGB 17.0  HCT 48.2  PLT 212    Chemistries   Recent Labs Lab 06/06/17 2017  NA 137  K 3.9  CL 102  CO2 28  GLUCOSE 95  BUN 17  CREATININE 1.01  CALCIUM 9.1    Follow-up Information    Erasmo Downer, MD. Go on 07/20/2017.   Specialty:  Family Medicine Why:  :00 AM PLEASE ARRIVE EARLY WITH INSURANCE AND I.D. Contact information: [address] [place] [address] [phone]           Management plans discussed with the patient, family and they are in agreement.  CODE STATUS: Prior   TOTAL TIME TAKING CARE OF THIS PATIENT: 45 minutes.    [name] M.D on 06/11/2017 at 1:24 PM  Between 7am to 6pm - Pager - [phone]  After 6pm go to [email or web address] - Social research officer, government  [facility]  Office  [phone]  CC: Primary care physician; Patient, No Pcp Per   Note: This dictation was prepared with Dragon dictation along with smaller phrase technology. Any transcriptional errors that result from this process are unintentional.

## 2017-07-20 ENCOUNTER — Ambulatory Visit: Payer: Self-pay | Admitting: Family Medicine

## 2019-07-14 IMAGING — CT CT ANGIO HEAD
1 of 8 series · 6 of 33 positions shown · IV contrast (isovue)
Comparison: Prior CT from 06/06/2017.

CLINICAL DATA: Initial evaluation for acute headache, syncope, the
sensory changes.

EXAM:
CT ANGIOGRAPHY HEAD AND NECK
TECHNIQUE: Multidetector CT imaging of the head and neck was performed using
the standard protocol during bolus administration of intravenous
contrast. Multiplanar CT image reconstructions and MIPs were
obtained to evaluate the vascular anatomy. Carotid stenosis
measurements (when applicable) are obtained utilizing NASCET
criteria, using the distal internal carotid diameter as the
denominator.
CONTRAST:  75 cc of Isovue 370.

[Series 6: ax thin · axial · 0.49mm/px · z∈[+1002,+1290]mm · 6 of 412 slices shown]
[im 59/412  soft-tissue]
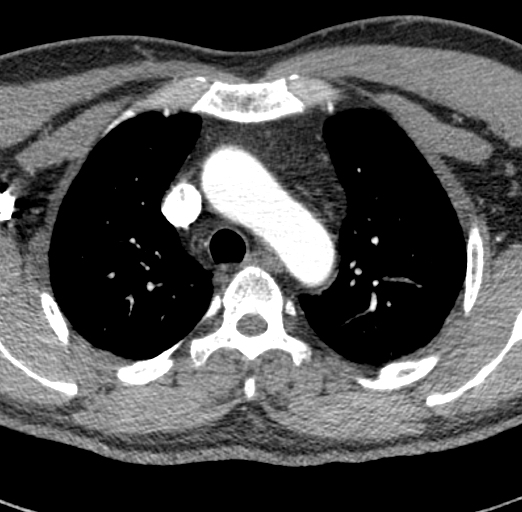
[im 118/412  bone]
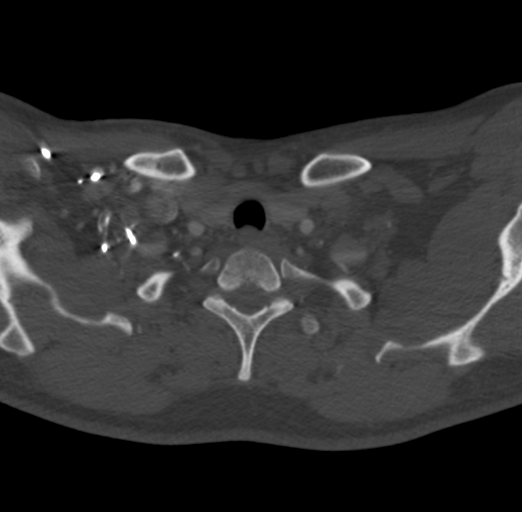
[im 177/412  soft-tissue]
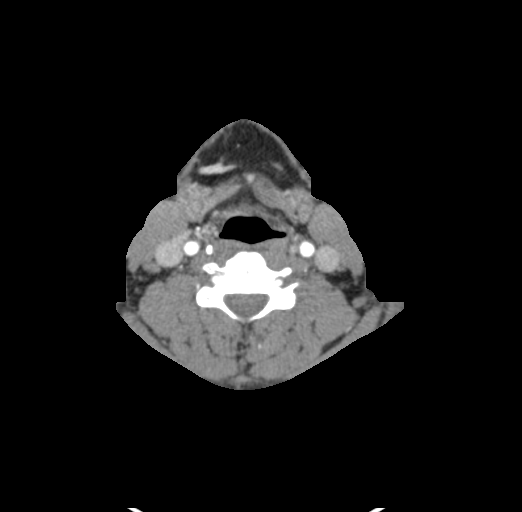
[im 235/412  bone]
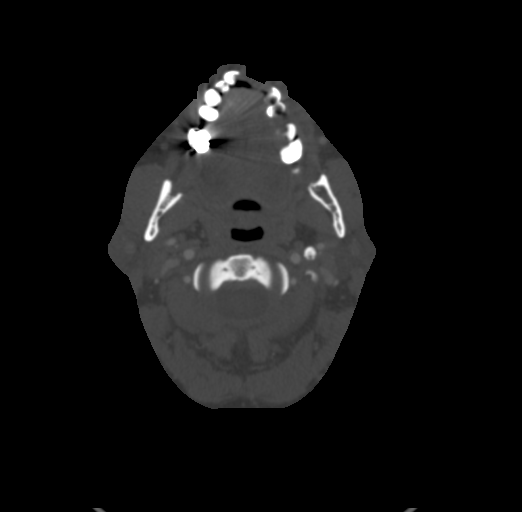
[im 294/412  soft-tissue]
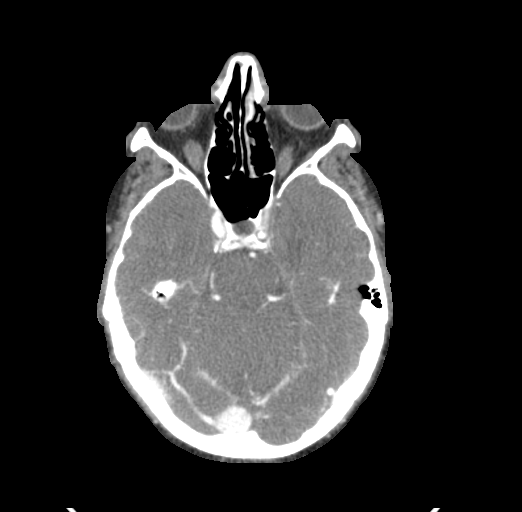
[im 353/412  bone]
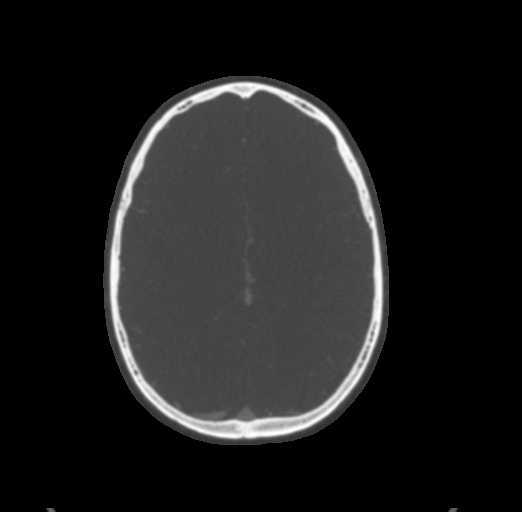

[6 of 33 positions shown; findings below may reference images not displayed]

FINDINGS: CTA NECK FINDINGS

Aortic arch: Visualized aortic arch of normal caliber with normal
branch pattern. No flow-limiting stenosis about the origin of the
great vessels. Visualized subclavian artery is widely patent.

Right carotid system: Right common carotid artery widely patent from
its origin to the bifurcation. Scattered calcified noncalcified
plaque about the right bifurcation without flow-limiting stenosis.
Right ICA widely patent distally to the skullbase without stenosis,
dissection, or occlusion.

Left carotid system: The centric noncalcified plaque present within
knee mid-distal left common carotid artery without flow-limiting
stenosis. Circumferential calcified noncalcified plaque about the
left carotid bifurcation/ proximal left ICA and without
hemodynamically significant stenosis. Left ICA patent distally to
the skullbase without stenosis, dissection, or occlusion.

Vertebral arteries: Both of the vertebral arteries arise from the
subclavian arteries. Right vertebral artery dominant. Vertebral
artery's patent within the neck without stenosis, dissection, or
occlusion.

Skeleton: No acute osseus abnormality. No worrisome lytic or blastic
osseous lesions.

Other neck: No acute soft tissue abnormality within the neck. No
adenopathy. Salivary glands within normal limits. Thyroid normal.

Upper chest: Visualized upper chest within normal limits. Partially
visualized lungs are clear.

Review of the MIP images confirms the above findings

CTA HEAD FINDINGS

Anterior circulation: ICA widely patent through the termini without
flow-limiting stenosis. ICA termini themselves are widely patent. A1
segments, anterior communicating artery, and anterior cerebral
arteries widely patent without flow-limiting stenosis. M1 segments
patent without stenosis or occlusion. MCA bifurcations normal. No
proximal M2 occlusion. Distal MCA branches well perfused and
symmetric.

Posterior circulation: Vertebral arteries patent to the
vertebrobasilar junction without stenosis. Right vertebral artery
dominant. Posterior inferior cerebral arteries patent bilaterally.
Basilar artery somewhat tortuous but widely patent to its distal
aspect. Superior cerebral arteries patent bilaterally. Left PCA
supplied via the basilar and is well perfused to its distal aspect
without stenosis. Fetal type origin of the right PCA supplied via a
widely patent right posterior communicating artery. Right PCA also
widely patent to its distal aspect.

Venous sinuses: Pain. Absent/hypoplastic straight sinus and vein of
[place], with the internal cerebral veins strained the a the straight
sinus.

Anatomic variants: Fetal type right PCA. No aneurysm vascular
malformation.

Delayed phase: No pathologic enhancement.

Review of the MIP images confirms the above findings
IMPRESSION: 1. Negative CTA for large vessel occlusion. No high-grade or
correctable stenosis within the major arterial vasculature of the
head and neck.
2. Mild carotid artery bifurcation atheromatous disease without
flow-limiting stenosis.
3. Widely patent vertebrobasilar system.

## 2019-08-11 ENCOUNTER — Other Ambulatory Visit: Payer: Self-pay

## 2019-08-11 DIAGNOSIS — Z20822 Contact with and (suspected) exposure to covid-19: Secondary | ICD-10-CM

## 2019-08-12 LAB — NOVEL CORONAVIRUS, NAA: SARS-CoV-2, NAA: NOT DETECTED

## 2019-08-15 ENCOUNTER — Other Ambulatory Visit: Payer: Self-pay

## 2019-08-15 DIAGNOSIS — Z20822 Contact with and (suspected) exposure to covid-19: Secondary | ICD-10-CM

## 2019-08-16 LAB — NOVEL CORONAVIRUS, NAA: SARS-CoV-2, NAA: NOT DETECTED

## 2020-02-19 ENCOUNTER — Ambulatory Visit
Admission: EM | Admit: 2020-02-19 | Discharge: 2020-02-19 | Disposition: A | Discharge status: Home / Self Care | Payer: 59

## 2020-02-19 ENCOUNTER — Other Ambulatory Visit: Payer: Self-pay

## 2020-02-19 ENCOUNTER — Encounter: Payer: Self-pay | Admitting: Emergency Medicine

## 2020-02-19 DIAGNOSIS — B349 Viral infection, unspecified: Secondary | ICD-10-CM

## 2020-02-19 HISTORY — DX: Hyperlipidemia, unspecified: E78.5

## 2020-02-19 NOTE — Discharge Instructions (Addendum)
Your COVID test is pending.  You should self quarantine until the test result is back.    Take Tylenol as needed for fever or discomfort.  Rest and keep yourself hydrated.    Go to the emergency department if you develop shortness of breath, severe diarrhea, high fever not relieved by Tylenol or ibuprofen, or other concerning symptoms.    

## 2020-02-19 NOTE — ED Triage Notes (Signed)
Patient in today c/o cough, chest congestion, headache and chills x 4 days. Patient has not taken his temperature. Patient has tried OTC Dayquil, Nyquil and Robitussin without relief. Patient has not had the covid vaccines. Patient has not had covid.

## 2020-02-19 NOTE — ED Provider Notes (Signed)
Bruce Kane    CSN: [id number] Arrival date & time: 02/19/20  1032      History   Chief Complaint Chief Complaint  Patient presents with  . Cough  . chest congestion  . Headache  . Chills    HPI Bruce Kane is a 49 y.o. male.   Patient presents with nonproductive cough, nasal congestion, sinus pressure, chills x4 days.  His wife has similar symptoms.  He is unsure if he has been febrile.  He denies sore throat, shortness of breath, vomiting, diarrhea, rash, or other symptoms.  Treatment attempted at home with DayQuil and NyQuil.  Former smoker.  He has not taken the COVID vaccines.  The history is provided by the patient.    Past Medical History:  Diagnosis Date  . Hyperlipidemia   . Patient denies medical problems     Patient Active Problem List   Diagnosis Date Noted  . Syncope 06/06/2017  . 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 NO PAST SURGERIES         Home Medications    Prior to Admission medications   Medication Sig Start Date End Date Taking? Authorizing Provider  Cyanocobalamin (VITAMIN B12 PO) Take 1 tablet by mouth daily.   Yes [provider]  rosuvastatin (CRESTOR) 20 MG tablet Take 20 mg by mouth daily. 01/08/20  Yes [provider]  HYDROcodone-acetaminophen (NORCO/VICODIN) 5-325 MG tablet Take 1 tablet by mouth every 6 (six) hours as needed for moderate pain. Patient not taking: Reported on 06/06/2017 04/30/17   Tommi Rumps, PA-C    Family History Family History  Problem Relation Age of Onset  . Uterine cancer Mother   . Congestive Heart Failure Father   . Hypertension Father     Social History Social History   Tobacco Use  . Smoking status: Former Smoker    Packs/day: 1.00    Types: Cigarettes    Quit date: 04/15/2019    Years since quitting: 0.8  . Smokeless tobacco: Never Used  Vaping Use  . Vaping Use: Never used  Substance Use Topics  .  Alcohol use: Yes    Comment: occasional  . Drug use: No     Allergies   Patient has no known allergies.   Review of Systems Review of Systems  Constitutional: Positive for chills. Negative for fever.  HENT: Positive for congestion and sinus pressure. Negative for ear pain and sore throat.   Eyes: Negative for pain and visual disturbance.  Respiratory: Positive for cough. Negative for shortness of breath.   Cardiovascular: Negative for chest pain and palpitations.  Gastrointestinal: Negative for abdominal pain, diarrhea, nausea and vomiting.  Genitourinary: Negative for dysuria and hematuria.  Musculoskeletal: Negative for arthralgias and back pain.  Skin: Negative for color change and rash.  Neurological: Negative for seizures and syncope.  All other systems reviewed and are negative.    Physical Exam Triage Vital Signs ED Triage Vitals  Enc Vitals Group     BP      Pulse      Resp      Temp      Temp src      SpO2      Weight      Height      Head Circumference      Peak Flow      Pain Score      Pain Loc  Pain Edu?      Excl. in [place]?    No data found.  Updated Vital Signs BP 127/87 (BP Location: Right Arm)   Pulse 63   Temp 98.1 F (36.7 C) (Oral)   Resp 18   Ht 5\' 8"  (1.727 m)   Wt 189 lb (85.7 kg)   SpO2 96%   BMI 28.74 kg/m   Visual Acuity Right Eye Distance:   Left Eye Distance:   Bilateral Distance:    Right Eye Near:   Left Eye Near:    Bilateral Near:     Physical Exam Vitals and nursing note reviewed.  Constitutional:      General: He is not in acute distress.    Appearance: He is well-developed.  HENT:     Head: Normocephalic and atraumatic.     Right Ear: Tympanic membrane normal.     Left Ear: Tympanic membrane normal.     Nose: Nose normal.     Mouth/Throat:     Mouth: Mucous membranes are moist.     Pharynx: Oropharynx is clear.  Eyes:     Conjunctiva/sclera: Conjunctivae normal.  Cardiovascular:     Rate and  Rhythm: Normal rate and regular rhythm.     Heart sounds: No murmur heard.   Pulmonary:     Effort: Pulmonary effort is normal. No respiratory distress.     Breath sounds: Normal breath sounds.  Abdominal:     Palpations: Abdomen is soft.     Tenderness: There is no abdominal tenderness. There is no guarding or rebound.  Musculoskeletal:     Cervical back: Neck supple.  Skin:    General: Skin is warm and dry.     Findings: No rash.  Neurological:     General: No focal deficit present.     Mental Status: He is alert and oriented to person, place, and time.     Gait: Gait normal.  Psychiatric:        Mood and Affect: Mood normal.        Behavior: Behavior normal.      UC Treatments / Results  Labs (all labs ordered are listed, but only abnormal results are displayed) Labs Reviewed  NOVEL CORONAVIRUS, NAA    EKG   Radiology No results found.  Procedures Procedures (including critical care time)  Medications Ordered in UC Medications - No data to display  Initial Impression / Assessment and Plan / UC Course  I have reviewed the triage vital signs and the nursing notes.  Pertinent labs & imaging results that were available during my care of the patient were reviewed by me and considered in my medical decision making (see chart for details).   Viral illness.  COVID test performed here.  Instructed patient to self quarantine until the test result is back.  Discussed with patient that he can take Tylenol as needed for fever or discomfort; Mucinex as needed for congestion.  Instructed patient to go to the emergency department if he develops high fever, shortness of breath, severe diarrhea, or other concerning symptoms.  Patient agrees with plan of care.     Final Clinical Impressions(s) / UC Diagnoses   Final diagnoses:  Viral illness     Discharge Instructions     Your COVID test is pending.  You should self quarantine until the test result is back.    Take  Tylenol as needed for fever or discomfort.  Rest and keep yourself hydrated.    Go to  the emergency department if you develop shortness of breath, severe diarrhea, high fever not relieved by Tylenol or ibuprofen, or other concerning symptoms.       ED Prescriptions    None     I have reviewed the PDMP during this encounter.   Mickie Bail, NP 02/19/20 1121

## 2020-02-20 LAB — NOVEL CORONAVIRUS, NAA: SARS-CoV-2, NAA: NOT DETECTED

## 2020-02-20 LAB — SARS-COV-2, NAA 2 DAY TAT

## 2020-05-20 ENCOUNTER — Emergency Department: Payer: PRIVATE HEALTH INSURANCE

## 2020-05-20 ENCOUNTER — Other Ambulatory Visit: Payer: Self-pay

## 2020-05-20 ENCOUNTER — Encounter: Payer: Self-pay | Admitting: Emergency Medicine

## 2020-05-20 DIAGNOSIS — I10 Essential (primary) hypertension: Secondary | ICD-10-CM | POA: Diagnosis not present

## 2020-05-20 DIAGNOSIS — Z79899 Other long term (current) drug therapy: Secondary | ICD-10-CM | POA: Insufficient documentation

## 2020-05-20 DIAGNOSIS — R110 Nausea: Secondary | ICD-10-CM | POA: Diagnosis not present

## 2020-05-20 DIAGNOSIS — Z20822 Contact with and (suspected) exposure to covid-19: Secondary | ICD-10-CM | POA: Diagnosis not present

## 2020-05-20 DIAGNOSIS — R5383 Other fatigue: Secondary | ICD-10-CM | POA: Diagnosis present

## 2020-05-20 DIAGNOSIS — R531 Weakness: Secondary | ICD-10-CM | POA: Insufficient documentation

## 2020-05-20 DIAGNOSIS — Z87891 Personal history of nicotine dependence: Secondary | ICD-10-CM | POA: Insufficient documentation

## 2020-05-20 DIAGNOSIS — R0789 Other chest pain: Secondary | ICD-10-CM | POA: Insufficient documentation

## 2020-05-20 LAB — BASIC METABOLIC PANEL
Anion gap: 9 (ref 5–15)
BUN: 17 mg/dL (ref 6–20)
CO2: 26 mmol/L (ref 22–32)
Calcium: 9.4 mg/dL (ref 8.9–10.3)
Chloride: 102 mmol/L (ref 98–111)
Creatinine, Ser: 0.95 mg/dL (ref 0.61–1.24)
GFR calc Af Amer: >60 mL/min (ref >60)
GFR calc non Af Amer: >60 mL/min (ref >60)
Glucose, Bld: 92 mg/dL (ref 70–99)
Potassium: 3.9 mmol/L (ref 3.5–5.1)
Sodium: 137 mmol/L (ref 135–145)

## 2020-05-20 LAB — CBC
HCT: 45.4 % (ref 39.0–52.0)
Hemoglobin: 16.2 g/dL (ref 13.0–17.0)
MCH: 30.7 pg (ref 26.0–34.0)
MCHC: 35.7 g/dL (ref 30.0–36.0)
MCV: 86 fL (ref 80.0–100.0)
Platelets: 202 10*3/uL (ref 150–400)
RBC: 5.28 MIL/uL (ref 4.22–5.81)
RDW: 12.8 % (ref 11.5–15.5)
WBC: 9.1 10*3/uL (ref 4.0–10.5)
nRBC: 0 % (ref 0.0–0.2)

## 2020-05-20 LAB — TROPONIN I (HIGH SENSITIVITY)
Troponin I (High Sensitivity): 4 ng/L (ref <18)
Troponin I (High Sensitivity): 3 ng/L (ref <18)

## 2020-05-20 NOTE — ED Triage Notes (Signed)
Pt via pov from home with sob, fatigue, cp. Fatigue started Monday, sob and cp started yesterday. Pt also reports dry mouth. Pt alert & oriented, nad noted.

## 2020-05-20 NOTE — ED Notes (Addendum)
Patient's spouse up to stat desk to report patient intends to leave. Patient and wife still outside

## 2020-05-21 ENCOUNTER — Emergency Department
Admission: EM | Admit: 2020-05-21 | Discharge: 2020-05-21 | Disposition: A | Discharge status: Home / Self Care | Payer: PRIVATE HEALTH INSURANCE | Attending: Emergency Medicine | Admitting: Emergency Medicine

## 2020-05-21 DIAGNOSIS — R531 Weakness: Secondary | ICD-10-CM

## 2020-05-21 DIAGNOSIS — I10 Essential (primary) hypertension: Secondary | ICD-10-CM

## 2020-05-21 LAB — URINALYSIS, COMPLETE (UACMP) WITH MICROSCOPIC
Bacteria, UA: NONE SEEN
Bilirubin Urine: NEGATIVE
Glucose, UA: NEGATIVE mg/dL
Hgb urine dipstick: NEGATIVE
Ketones, ur: 20 mg/dL — AB
Leukocytes,Ua: NEGATIVE
Nitrite: NEGATIVE
Protein, ur: NEGATIVE mg/dL
Specific Gravity, Urine: 1.013 (ref 1.005–1.030)
pH: 5 (ref 5.0–8.0)

## 2020-05-21 LAB — TSH: TSH: 2.013 u[IU]/mL (ref 0.350–4.500)

## 2020-05-21 LAB — T4, FREE: Free T4: 0.99 ng/dL (ref 0.61–1.12)

## 2020-05-21 LAB — SARS CORONAVIRUS 2 BY RT PCR (HOSPITAL ORDER, PERFORMED IN ~~LOC~~ HOSPITAL LAB): SARS Coronavirus 2: NEGATIVE

## 2020-05-21 MED ORDER — AMLODIPINE BESYLATE 2.5 MG PO TABS: 2.5000 mg | ORAL_TABLET | Freq: Every day | ORAL | 0 refills | Status: DC

## 2020-05-21 NOTE — ED Provider Notes (Signed)
[facility] Emergency Department Provider Note  ____________________________________________   First MD Initiated Contact with Patient 05/21/20 1125     (approximate)  I have reviewed the triage vital signs and the nursing notes.   HISTORY  Chief Complaint Fatigue, Shortness of Breath, and Chest Pain    HPI Bruce Kane is a 49 y.o. male  With h/o HLD here with general fatigue, weakness.   Patient states that for the last several days, he has noticed intermittent episodes in which he feels fatigued, with occasional very transient left-sided chest discomfort.  He feels occasionally "cold" as well, and more fatigued than he would expect.  Denies any recent changes other than recent increase in stress.  Denies any cough or shortness of breath.  Denies any nausea or vomiting.  He does intermittently have some mild epigastric discomfort, that relieved with belching.  Denies any regular aspirin use, though he does take 1 Aleve per day.  No melena or hematochezia.  No specific alleviating or aggravating factors.       Past Medical History:  Diagnosis Date  . Hyperlipidemia   . Patient denies medical problems     Patient Active Problem List   Diagnosis Date Noted  . Syncope 06/06/2017  . 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 NO PAST SURGERIES      Prior to Admission medications   Medication Sig Start Date End Date Taking? Authorizing Provider  amLODipine (NORVASC) 2.5 MG tablet Take 1 tablet (2.5 mg total) by mouth daily. 05/21/20 05/21/21  Shaune Pollack, MD  Cyanocobalamin (VITAMIN B12 PO) Take 1 tablet by mouth daily.    [provider]  HYDROcodone-acetaminophen (NORCO/VICODIN) 5-325 MG tablet Take 1 tablet by mouth every 6 (six) hours as needed for moderate pain. Patient not taking: Reported on 06/06/2017 04/30/17   Tommi Rumps, PA-C  rosuvastatin (CRESTOR) 20 MG tablet Take 20 mg by  mouth daily. 01/08/20   [provider]    Allergies Patient has no known allergies.  Family History  Problem Relation Age of Onset  . Uterine cancer Mother   . Congestive Heart Failure Father   . Hypertension Father     Social History Social History   Tobacco Use  . Smoking status: Former Smoker    Packs/day: 1.00    Types: Cigarettes    Quit date: 04/15/2019    Years since quitting: 1.1  . Smokeless tobacco: Never Used  Vaping Use  . Vaping Use: Never used  Substance Use Topics  . Alcohol use: Yes    Comment: occasional  . Drug use: Not Currently    Review of Systems  Review of Systems  Constitutional: Positive for fatigue. Negative for chills and fever.  HENT: Negative for sore throat.   Respiratory: Positive for chest tightness. Negative for shortness of breath.   Cardiovascular: Negative for chest pain.  Gastrointestinal: Positive for nausea. Negative for abdominal pain.  Genitourinary: Negative for flank pain.  Musculoskeletal: Negative for neck pain.  Skin: Negative for rash and wound.  Allergic/Immunologic: Negative for immunocompromised state.  Neurological: Positive for weakness. Negative for numbness.  Hematological: Does not bruise/bleed easily.  All other systems reviewed and are negative.    ____________________________________________  PHYSICAL EXAM:      VITAL SIGNS: ED Triage Vitals  Enc Vitals Group     BP 05/20/20 1801 (!) 154/128     Pulse Rate 05/20/20 1801 76  Resp 05/20/20 1801 20     Temp 05/20/20 1801 98.2 F (36.8 C)     Temp Source 05/20/20 1801 Oral     SpO2 05/20/20 1801 98 %     Weight 05/20/20 1802 180 lb (81.6 kg)     Height 05/20/20 1802 5\' 8"  (1.727 m)     Head Circumference --      Peak Flow --      Pain Score 05/20/20 1801 2     Pain Loc --      Pain Edu? --      Excl. in GC? --      Physical Exam Vitals and nursing note reviewed.  Constitutional:      General: He is not in acute distress.     Appearance: He is well-developed.  HENT:     Head: Normocephalic and atraumatic.  Eyes:     Conjunctiva/sclera: Conjunctivae normal.  Cardiovascular:     Rate and Rhythm: Normal rate and regular rhythm.     Heart sounds: Normal heart sounds. No murmur heard.  No friction rub.  Pulmonary:     Effort: Pulmonary effort is normal. No respiratory distress.     Breath sounds: Normal breath sounds. No wheezing or rales.  Abdominal:     General: There is no distension.     Palpations: Abdomen is soft.     Tenderness: There is no abdominal tenderness.  Musculoskeletal:     Cervical back: Neck supple.  Skin:    General: Skin is warm.     Capillary Refill: Capillary refill takes less than 2 seconds.  Neurological:     Mental Status: He is alert and oriented to person, place, and time.     Motor: No abnormal muscle tone.       ____________________________________________   LABS (all labs ordered are listed, but only abnormal results are displayed)  Labs Reviewed  URINALYSIS, COMPLETE (UACMP) WITH MICROSCOPIC - Abnormal; Notable for the following components:      Result Value   Color, Urine YELLOW (*)    APPearance CLEAR (*)    Ketones, ur 20 (*)    All other components within normal limits  SARS CORONAVIRUS 2 BY RT PCR (HOSPITAL ORDER, PERFORMED IN [facility])  BASIC METABOLIC PANEL  CBC  TSH  T4, FREE  TROPONIN I (HIGH SENSITIVITY)  TROPONIN I (HIGH SENSITIVITY)    ____________________________________________  EKG: Normal sinus rhythm, ventricular rate 72.  PR 136, QRS 88, QTc 398.  No acute ST elations or depressions. ________________________________________  RADIOLOGY All imaging, including plain films, CT scans, and ultrasounds, independently reviewed by me, and interpretations confirmed via formal radiology reads.  ED MD interpretation:   Reviewed, chest x-ray negative  Official radiology report(s): DG Chest 2 View  Result Date:  05/20/2020 CLINICAL DATA:  Shortness of breath, fatigue and chest pain EXAM: CHEST - 2 VIEW COMPARISON:  Radiograph 11/11/2010 FINDINGS: No consolidation, features of edema, pneumothorax, or effusion. Pulmonary vascularity is normally distributed. The cardiomediastinal contours are unremarkable. No acute osseous or soft tissue abnormality. IMPRESSION: No acute cardiopulmonary abnormality. Electronically Signed   By: 01/11/2011 M.D.   On: 05/20/2020 18:43    ____________________________________________  PROCEDURES   Procedure(s) performed (including Critical Care):  Procedures  ____________________________________________  INITIAL IMPRESSION / MDM / ASSESSMENT AND PLAN / ED COURSE  As part of my medical decision making, I reviewed the following data within the electronic MEDICAL RECORD NUMBER Nursing notes reviewed and incorporated, Old  chart reviewed, Notes from prior ED visits, and [place] Controlled Substance Database       *Bruce Kane was evaluated in Emergency Department on 05/21/2020 for the symptoms described in the history of present illness. He was evaluated in the context of the global COVID-19 pandemic, which necessitated consideration that the patient might be at risk for infection with the SARS-CoV-2 virus that causes COVID-19. Institutional protocols and algorithms that pertain to the evaluation of patients at risk for COVID-19 are in a state of rapid change based on information released by regulatory bodies including the CDC and federal and state organizations. These policies and algorithms were followed during the patient's care in the ED.  Some ED evaluations and interventions may be delayed as a result of limited staffing during the pandemic.*     Medical Decision Making: 49 year old male here with intermittent vague chest discomfort and fatigue.  Suspect possible symptomatic hypertension given this seems to correlate with increased blood pressure at home.  Pain is not sharp,  tearing, is only intermittent, he has symmetric pulses with no evidence to suggest dissection.  Symptoms are more consistent with PE.  EKG is nonischemic with negative troponins x2 and low risk heart score, do not suspect ACS.  He does note this correlates with increased rest at home.  Will start him on a low-dose amlodipine as he has had elevated pressures consistently at home, as well as advised him to take Pepcid for possible component of GERD/gastritis related to his epigastric discomfort.  Return precautions given PCP referral given.  ____________________________________________  FINAL CLINICAL IMPRESSION(S) / ED DIAGNOSES  Final diagnoses:  Generalized weakness  Essential hypertension     MEDICATIONS GIVEN DURING THIS VISIT:  Medications - No data to display   ED Discharge Orders         Ordered    amLODipine (NORVASC) 2.5 MG tablet  Daily,   Status:  Discontinued        05/21/20 1216    amLODipine (NORVASC) 2.5 MG tablet  Daily        05/21/20 1217           Note:  This document was prepared using Dragon voice recognition software and may include unintentional dictation errors.   Shaune Pollack, MD 05/21/20 1226

## 2020-05-21 NOTE — Discharge Instructions (Addendum)
Start taking the amlodipine as prescribed  Try taking Pepcid (or generic Famotidine) with your naproxen to help with reflux  Check your BP daily  Follow-up with your PCP in the next week

## 2020-05-21 NOTE — ED Notes (Signed)
Pt here for fatigue, left axillary pain, and headaches. Ambulatory without increased WOB. NAD. No sore throat.  Denies abdominal pain but has felt bloated.  Denies NVD.

## 2020-09-25 ENCOUNTER — Emergency Department
Admission: EM | Admit: 2020-09-25 | Discharge: 2020-09-25 | Discharge status: Against Medical Advice | Payer: 59 | Attending: Emergency Medicine | Admitting: Emergency Medicine

## 2020-09-25 ENCOUNTER — Encounter: Payer: Self-pay | Admitting: Emergency Medicine

## 2020-09-25 ENCOUNTER — Other Ambulatory Visit: Payer: Self-pay

## 2020-09-25 ENCOUNTER — Emergency Department: Payer: 59

## 2020-09-25 DIAGNOSIS — R5383 Other fatigue: Secondary | ICD-10-CM | POA: Diagnosis not present

## 2020-09-25 DIAGNOSIS — R0789 Other chest pain: Secondary | ICD-10-CM | POA: Diagnosis not present

## 2020-09-25 DIAGNOSIS — Z79899 Other long term (current) drug therapy: Secondary | ICD-10-CM | POA: Insufficient documentation

## 2020-09-25 DIAGNOSIS — I10 Essential (primary) hypertension: Secondary | ICD-10-CM | POA: Insufficient documentation

## 2020-09-25 DIAGNOSIS — Z87891 Personal history of nicotine dependence: Secondary | ICD-10-CM | POA: Diagnosis not present

## 2020-09-25 DIAGNOSIS — M546 Pain in thoracic spine: Secondary | ICD-10-CM | POA: Diagnosis not present

## 2020-09-25 DIAGNOSIS — R079 Chest pain, unspecified: Secondary | ICD-10-CM | POA: Diagnosis present

## 2020-09-25 DIAGNOSIS — R42 Dizziness and giddiness: Secondary | ICD-10-CM | POA: Diagnosis not present

## 2020-09-25 HISTORY — DX: Essential (primary) hypertension: I10

## 2020-09-25 LAB — BASIC METABOLIC PANEL
Anion gap: 13 (ref 5–15)
BUN: 15 mg/dL (ref 6–20)
CO2: 22 mmol/L (ref 22–32)
Calcium: 9.2 mg/dL (ref 8.9–10.3)
Chloride: 104 mmol/L (ref 98–111)
Creatinine, Ser: 0.79 mg/dL (ref 0.61–1.24)
GFR, Estimated: >60 mL/min (ref >60)
Glucose, Bld: 106 mg/dL — ABNORMAL HIGH (ref 70–99)
Potassium: 3.9 mmol/L (ref 3.5–5.1)
Sodium: 139 mmol/L (ref 135–145)

## 2020-09-25 LAB — LIPASE, BLOOD: Lipase: 24 U/L (ref 11–51)

## 2020-09-25 LAB — CBC
HCT: 49.6 % (ref 39.0–52.0)
Hemoglobin: 17 g/dL (ref 13.0–17.0)
MCH: 30.3 pg (ref 26.0–34.0)
MCHC: 34.3 g/dL (ref 30.0–36.0)
MCV: 88.4 fL (ref 80.0–100.0)
Platelets: 223 10*3/uL (ref 150–400)
RBC: 5.61 MIL/uL (ref 4.22–5.81)
RDW: 13.1 % (ref 11.5–15.5)
WBC: 13.7 10*3/uL — ABNORMAL HIGH (ref 4.0–10.5)
nRBC: 0 % (ref 0.0–0.2)

## 2020-09-25 LAB — TROPONIN I (HIGH SENSITIVITY)
Troponin I (High Sensitivity): 2 ng/L (ref <18)
Troponin I (High Sensitivity): 3 ng/L (ref <18)

## 2020-09-25 NOTE — ED Notes (Signed)
Pt requesting to leave AMA. PA Woods aware. AMA form signed and risk education provided. Pt ambulatory from room out to WR.

## 2020-09-25 NOTE — ED Provider Notes (Signed)
[facility]-EMERGENCY DEPARTMENT  ____________________________________________  Time seen: Approximately 10:22 PM  I have reviewed the triage vital signs and the nursing notes.   HISTORY  Chief Complaint Fatigue and Dizziness   Historian Patient     HPI Bruce Kane is a 51 y.o. male presents to the emergency department with left upper back pain and chest tightness that has occurred for the past 2 to 3 days.  Patient denies cough or fever.  He denies bilateral leg swelling.  No rhinorrhea, nasal congestion or nonproductive cough.  He denies fever.  Patient has a history of hypertension but denies other cardiac issues.  No nausea, vomiting or abdominal pain.  Patient states that he has been exceptionally anxious intermittently over the past year since his father passed away.  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  Patient is accompanied by his wife who wants to figure out the source of patient's discomfort.  Past Medical History:  Diagnosis Date  . Hyperlipidemia   . Hypertension   . Patient denies medical problems      Immunizations up to date:  Yes.     Past Medical History:  Diagnosis Date  . Hyperlipidemia   . Hypertension   . Patient denies medical problems     Patient Active Problem List   Diagnosis Date Noted  . Syncope 06/06/2017  . 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 NO PAST SURGERIES      Prior to Admission medications   Medication Sig Start Date End Date Taking? Authorizing Provider  amLODipine (NORVASC) 2.5 MG tablet Take 1 tablet (2.5 mg total) by mouth daily. 05/21/20 05/21/21  Shaune Pollack, MD  Cyanocobalamin (VITAMIN B12 PO) Take 1 tablet by mouth daily.    [provider]  HYDROcodone-acetaminophen (NORCO/VICODIN) 5-325 MG tablet Take 1 tablet by mouth every 6 (six)  hours as needed for moderate pain. Patient not taking: Reported on 06/06/2017 04/30/17   Tommi Rumps, PA-C  rosuvastatin (CRESTOR) 20 MG tablet Take 20 mg by mouth daily. 01/08/20   [provider]    Allergies Patient has no known allergies.  Family History  Problem Relation Age of Onset  . Uterine cancer Mother   . Congestive Heart Failure Father   . Hypertension Father     Social History Social History   Tobacco Use  . Smoking status: Former Smoker    Packs/day: 1.00    Types: Cigarettes    Quit date: 04/15/2019    Years since quitting: 1.4  . Smokeless tobacco: Never Used  Vaping Use  . Vaping Use: Never used  Substance Use Topics  . Alcohol use: Yes    Comment: occasional  . Drug use: Not Currently     Review of Systems  Constitutional: No fever/chills Eyes:  No discharge ENT: No upper respiratory complaints. Respiratory: no cough. No SOB/ use of accessory muscles to breath Cardiac: Patient has chest tightness.  Gastrointestinal:   No nausea, no vomiting.  No diarrhea.  No constipation. Musculoskeletal: Negative for musculoskeletal pain. Skin: Negative for rash, abrasions, lacerations, ecchymosis.   ____________________________________________   PHYSICAL EXAM:  VITAL SIGNS: ED Triage Vitals  Enc Vitals Group     BP 09/25/20 1635 (!) 157/95     Pulse Rate 09/25/20 1635 97     Resp 09/25/20 1635  20     Temp 09/25/20 1635 98.2 F (36.8 C)     Temp Source 09/25/20 1635 Oral     SpO2 09/25/20 1635 100 %     Weight 09/25/20 1636 160 lb (72.6 kg)     Height 09/25/20 1636 5\' 8"  (1.727 m)     Head Circumference --      Peak Flow --      Pain Score 09/25/20 1636 3     Pain Loc --      Pain Edu? --      Excl. in GC? --      Constitutional: Alert and oriented. Well appearing and in no acute distress. Eyes: Conjunctivae are normal. PERRL. EOMI. Head: Atraumatic. ENT:      Nose: No congestion/rhinnorhea.      Mouth/Throat: Mucous membranes  are moist.  Neck: No stridor.  No cervical spine tenderness to palpation. Cardiovascular: Normal rate, regular rhythm. Normal S1 and S2.  Good peripheral circulation. Respiratory: Normal respiratory effort without tachypnea or retractions. Lungs CTAB. Good air entry to the bases with no decreased or absent breath sounds Gastrointestinal: Bowel sounds x 4 quadrants. Soft and nontender to palpation. No guarding or rigidity. No distention. Musculoskeletal: Full range of motion to all extremities. No obvious deformities noted Neurologic:  Normal for age. No gross focal neurologic deficits are appreciated.  Skin:  Skin is warm, dry and intact. No rash noted. Psychiatric: Mood and affect are normal for age. Speech and behavior are normal.   ____________________________________________   LABS (all labs ordered are listed, but only abnormal results are displayed)  Labs Reviewed  BASIC METABOLIC PANEL - Abnormal; Notable for the following components:      Result Value   Glucose, Bld 106 (*)    All other components within normal limits  CBC - Abnormal; Notable for the following components:   WBC 13.7 (*)    All other components within normal limits  LIPASE, BLOOD  TROPONIN I (HIGH SENSITIVITY)  TROPONIN I (HIGH SENSITIVITY)   ____________________________________________  EKG   ____________________________________________  RADIOLOGY 09/27/20, personally viewed and evaluated these images (plain radiographs) as part of my medical decision making, as well as reviewing the written report by the radiologist.    No results found.  ____________________________________________    PROCEDURES  Procedure(s) performed:     Procedures     Medications - No data to display   ____________________________________________   INITIAL IMPRESSION / ASSESSMENT AND PLAN / ED COURSE  Pertinent labs & imaging results that were available during my care of the patient were reviewed  by me and considered in my medical decision making (see chart for details).       Assessment and Plan:  Chest tightness Upper back pain 50 year old male presents to the emergency department with upper back pain, chest tightness, occasional breathlessness and anxiety.  Patient was mildly hypertensive at triage but vital signs were otherwise reassuring.  On exam he had no increased work of breathing and no adventitious lung sounds were auscultated.  Differential diagnosis include community-acquired pneumonia, PE, COVID-19, pancreatitis...  Patient decided to leave the emergency department AMA as he did not want to wait for CT.  BMP was reassuring.  CBC indicated mild leukocytosis.  Troponin was within reference range.  EKG reveals normal sinus rhythm without ST segment elevation or other apparent arrhythmia.  Risks of leaving AMA were conveyed to the patient.  ____________________________________________  FINAL CLINICAL IMPRESSION(S) / ED DIAGNOSES  Final  diagnoses:  Chest tightness      NEW MEDICATIONS STARTED DURING THIS VISIT:  ED Discharge Orders    None          This chart was dictated using voice recognition software/Dragon. Despite best efforts to proofread, errors can occur which can change the meaning. Any change was purely unintentional.     Orvil Feil, PA-C 09/25/20 2356    Dionne Bucy, MD 09/25/20 [phone]

## 2020-09-25 NOTE — ED Triage Notes (Signed)
Pt via POV from home. Pt c/o dizziness, fatigue, L shoulder blade pain and R upper back pain. Denies SOB. Denies NVD. Pt is A&Ox4 and NAD.

## 2020-10-12 ENCOUNTER — Inpatient Hospital Stay: Payer: No Typology Code available for payment source | Attending: Internal Medicine | Admitting: Internal Medicine

## 2020-10-12 ENCOUNTER — Encounter: Payer: Self-pay | Admitting: *Deleted

## 2020-10-12 ENCOUNTER — Other Ambulatory Visit: Payer: Self-pay | Admitting: *Deleted

## 2020-10-12 ENCOUNTER — Inpatient Hospital Stay: Payer: No Typology Code available for payment source

## 2020-10-12 DIAGNOSIS — E785 Hyperlipidemia, unspecified: Secondary | ICD-10-CM | POA: Diagnosis not present

## 2020-10-12 DIAGNOSIS — F17210 Nicotine dependence, cigarettes, uncomplicated: Secondary | ICD-10-CM | POA: Insufficient documentation

## 2020-10-12 DIAGNOSIS — R5383 Other fatigue: Secondary | ICD-10-CM | POA: Insufficient documentation

## 2020-10-12 DIAGNOSIS — Z7982 Long term (current) use of aspirin: Secondary | ICD-10-CM | POA: Insufficient documentation

## 2020-10-12 DIAGNOSIS — D729 Disorder of white blood cells, unspecified: Secondary | ICD-10-CM

## 2020-10-12 DIAGNOSIS — E538 Deficiency of other specified B group vitamins: Secondary | ICD-10-CM | POA: Diagnosis not present

## 2020-10-12 DIAGNOSIS — I10 Essential (primary) hypertension: Secondary | ICD-10-CM | POA: Insufficient documentation

## 2020-10-12 DIAGNOSIS — Z79899 Other long term (current) drug therapy: Secondary | ICD-10-CM | POA: Diagnosis not present

## 2020-10-12 DIAGNOSIS — F419 Anxiety disorder, unspecified: Secondary | ICD-10-CM | POA: Diagnosis not present

## 2020-10-12 DIAGNOSIS — D72829 Elevated white blood cell count, unspecified: Secondary | ICD-10-CM | POA: Insufficient documentation

## 2020-10-12 DIAGNOSIS — R5381 Other malaise: Secondary | ICD-10-CM | POA: Diagnosis not present

## 2020-10-12 LAB — CBC WITH DIFFERENTIAL/PLATELET
Abs Immature Granulocytes: 0.02 10*3/uL (ref 0.00–0.07)
Basophils Absolute: 0 10*3/uL (ref 0.0–0.1)
Basophils Relative: 0 %
Eosinophils Absolute: 0.1 10*3/uL (ref 0.0–0.5)
Eosinophils Relative: 1 %
HCT: 47.5 % (ref 39.0–52.0)
Hemoglobin: 16.7 g/dL (ref 13.0–17.0)
Immature Granulocytes: 0 %
Lymphocytes Relative: 24 %
Lymphs Abs: 2.4 10*3/uL (ref 0.7–4.0)
MCH: 30.5 pg (ref 26.0–34.0)
MCHC: 35.2 g/dL (ref 30.0–36.0)
MCV: 86.7 fL (ref 80.0–100.0)
Monocytes Absolute: 0.5 10*3/uL (ref 0.1–1.0)
Monocytes Relative: 5 %
Neutro Abs: 7 10*3/uL (ref 1.7–7.7)
Neutrophils Relative %: 70 %
Platelets: 224 10*3/uL (ref 150–400)
RBC: 5.48 MIL/uL (ref 4.22–5.81)
RDW: 12.8 % (ref 11.5–15.5)
WBC: 10 10*3/uL (ref 4.0–10.5)
nRBC: 0 % (ref 0.0–0.2)

## 2020-10-12 LAB — TECHNOLOGIST SMEAR REVIEW
Plt Morphology: NORMAL
RBC Morphology: NORMAL
WBC Morphology: NORMAL

## 2020-10-12 LAB — C-REACTIVE PROTEIN: CRP: 1.2 mg/dL — ABNORMAL HIGH (ref <1.0)

## 2020-10-12 LAB — LACTATE DEHYDROGENASE: LDH: 113 U/L (ref 98–192)

## 2020-10-12 NOTE — Progress Notes (Signed)
[facility] CONSULT NOTE  Patient Care Team: Su Monks, [place] as PCP - General (Physician Assistant)  CHIEF COMPLAINTS/PURPOSE OF CONSULTATION: LEUCOCYTOSIS  #Leukocytosis-intermittently neutrophilia [10.5-13]; normal hemoglobin/platelets.  #Active smoker; intact spleen; psoriasis Oncology History   No history exists.     HISTORY OF PRESENTING ILLNESS:  Bruce Kane 50 y.o.  male history of smoking-has been referred to [place] for further evaluation recommendations for leukocytosis.  Patient has had intermittent/elevated white count/leukocytosis. Denies any fevers or chills. Denies any night sweats.   Unfortunately patient has been smoking. He quit recently but given anxiety/stress-back to smoking. He has lost about 30 pounds last year which he attributes to dentures; father passing away in April 2021.  Infections: None Steroids- NONE; topical steroids for psoriasis. Spleen: intact  Smoking: Yes   Review of Systems  Constitutional: Positive for malaise/fatigue. Negative for chills, diaphoresis, fever and weight loss.  HENT: Negative for nosebleeds and sore throat.   Eyes: Negative for double vision.  Respiratory: Negative for cough, hemoptysis, sputum production, shortness of breath and wheezing.   Cardiovascular: Negative for chest pain, palpitations, orthopnea and leg swelling.  Gastrointestinal: Negative for abdominal pain, blood in stool, constipation, diarrhea, heartburn, melena, nausea and vomiting.  Genitourinary: Negative for dysuria, frequency and urgency.  Musculoskeletal: Negative for back pain and joint pain.  Skin: Negative.  Negative for itching and rash.  Neurological: Negative for dizziness, tingling, focal weakness, weakness and headaches.  Endo/Heme/Allergies: Does not bruise/bleed easily.  Psychiatric/Behavioral: Negative for depression. The patient is not nervous/anxious and does not have insomnia.      MEDICAL HISTORY:  Past  Medical History:  Diagnosis Date  . Hyperlipidemia   . Hypertension   . Leukocytosis   . Psoriasis   . Vitamin B12 deficiency     SURGICAL HISTORY: Past Surgical History:  Procedure Laterality Date  . NO PAST SURGERIES      SOCIAL HISTORY: Social History   Socioeconomic History  . Marital status: Married    Spouse name: Not on file  . Number of children: Not on file  . Years of education: Not on file  . Highest education level: Not on file  Occupational History  . Not on file  Tobacco Use  . Smoking status: Former Smoker    Packs/day: 1.00    Years: 37.00    Pack years: 37.00    Types: Cigarettes    Quit date: 04/15/2019    Years since quitting: 1.4  . Smokeless tobacco: Never Used  Vaping Use  . Vaping Use: Never used  Substance and Sexual Activity  . Alcohol use: Yes    Comment: occasional-  1 Glass of wine per week   . Drug use: Not Currently  . Sexual activity: Yes  Other Topics Concern  . Not on file  Social History Narrative  . Not on file   Social Determinants of Health   Financial Resource Strain: Not on file  Food Insecurity: Not on file  Transportation Needs: Not on file  Physical Activity: Not on file  Stress: Not on file  Social Connections: Not on file  Intimate Partner Violence: Not on file    FAMILY HISTORY: Family History  Problem Relation Age of Onset  . Uterine cancer Mother   . Congestive Heart Failure Father   . Hypertension Father     ALLERGIES:  has No Known Allergies.  MEDICATIONS:  Current Outpatient Medications  Medication Sig Dispense Refill  . amLODipine (NORVASC) 10 MG tablet  Take 1 tablet by mouth daily.    Marland Kitchen aspirin 81 MG EC tablet Take 1 tablet by mouth daily.    . Cyanocobalamin (VITAMIN B12 PO) Take 1 tablet by mouth daily.    . Cyanocobalamin POWD Take by mouth.    . rosuvastatin (CRESTOR) 20 MG tablet Take 20 mg by mouth daily.    Marland Kitchen triamcinolone (KENALOG) 0.1 % Apply 1 application topically 2 (two) times  daily as needed. psoriasis     No current facility-administered medications for this visit.      Marland Kitchen  PHYSICAL EXAMINATION: ECOG PERFORMANCE STATUS: 0 - Asymptomatic  Vitals:   10/12/20 1121  BP: (!) 127/92  Pulse: 96  Resp: 16  Temp: 99.3 F (37.4 C)  SpO2: 99%   Filed Weights   10/12/20 1121  Weight: 156 lb (70.8 kg)    Physical Exam HENT:     Head: Normocephalic and atraumatic.     Mouth/Throat:     Mouth: Oropharynx is clear and moist.     Pharynx: No oropharyngeal exudate.  Eyes:     Pupils: Pupils are equal, round, and reactive to light.  Cardiovascular:     Rate and Rhythm: Normal rate and regular rhythm.  Pulmonary:     Effort: Pulmonary effort is normal. No respiratory distress.     Breath sounds: Normal breath sounds. No wheezing.  Abdominal:     General: Bowel sounds are normal. There is no distension.     Palpations: Abdomen is soft. There is no mass.     Tenderness: There is no abdominal tenderness. There is no guarding or rebound.  Musculoskeletal:        General: No tenderness or edema. Normal range of motion.     Cervical back: Normal range of motion and neck supple.  Skin:    General: Skin is warm.  Neurological:     Mental Status: He is alert and oriented to person, place, and time.  Psychiatric:        Mood and Affect: Affect normal.      LABORATORY DATA:  I have reviewed the data as listed Lab Results  Component Value Date   WBC 13.7 (H) 09/25/2020   HGB 17.0 09/25/2020   HCT 49.6 09/25/2020   MCV 88.4 09/25/2020   PLT 223 09/25/2020   Recent Labs    05/20/20 1806 09/25/20 1642  NA 137 139  K 3.9 3.9  CL 102 104  CO2 26 22  GLUCOSE 92 106*  BUN 17 15  CREATININE 0.95 0.79  CALCIUM 9.4 9.2  GFRNONAA >60 >60  GFRAA >60  --     RADIOGRAPHIC STUDIES: I have personally reviewed the radiological images as listed and agreed with the findings in the report. No results found.  ASSESSMENT & PLAN:    Neutrophilia #Intermittent leukocytosis/neutrophilia. Normal hemoglobin/platelets. Suspect benign causes like infection/inflammation/smoking. Chest x-ray September 2021 within normal limits. Clinically less suspicious for any malignant process.   #Active smoker/anxiety-recommend quitting smoking; understands the potential health risks. Recommend talking to PCP regarding antidepressant like Wellbutrin which can also help with anxiety; and help quitting smoking.  #Age-appropriate screening: Awaiting colonoscopy next week.  #Check CBC; LDH CRP BCR ABL review of peripheral smear.  Thank you Ms.Verline Lema, PA for allowing me to participate in the care of your pleasant patient. Please do not hesitate to contact me with questions or concerns in the interim. There were plan of care was discussed with the patient and his wife in detail. Management.  #  DISPOSITION:[phone]- will call # labs today # follow up TBD-Dr.B  # 45 minutes face-to-face with the patient discussing the above plan of care; more than 50% of time spent on prognosis/ natural history; counseling and coordination.   All questions were answered. The patient knows to call the clinic with any problems, questions or concerns.    Earna Coder, MD 10/12/2020 12:01 PM

## 2020-10-12 NOTE — Assessment & Plan Note (Addendum)
#  Intermittent leukocytosis/neutrophilia. Normal hemoglobin/platelets. Suspect benign causes like infection/inflammation/smoking. Chest x-ray September 2021 within normal limits. Clinically less suspicious for any malignant process.   #Active smoker/anxiety-recommend quitting smoking; understands the potential health risks. Recommend talking to PCP regarding antidepressant like Wellbutrin which can also help with anxiety; and help quitting smoking.  #Age-appropriate screening: Awaiting colonoscopy next week.  #Check CBC; L[facility] CRP BCR ABL review of peripheral smear.  Thank you Ms.Verline Lema, PA for allowing me to participate in the care of your pleasant patient. Please do not hesitate to contact me with questions or concerns in the interim. There were plan of care was discussed with the patient and his wife in detail. Management.  # [phone]- will call # labs today # follow up TBD-Dr.B  # 45 minutes face-to-face with the patient discussing the above plan of care; more than 50% of time spent on prognosis/ natural history; counseling and coordination.

## 2020-10-13 ENCOUNTER — Telehealth: Payer: Self-pay | Admitting: Internal Medicine

## 2020-10-13 NOTE — Telephone Encounter (Signed)
On 2/09-I spoke to patient regarding the results of the blood work-white count 10.0/rest of the CBC normal.  Intermittent elevation of white count likely secondary to smoking/inflammatory state.  I do not suspect any malignancy based upon above work-up.  BCR FISH panel pending-we will call patient if abnormal.  No further follow-up recommended.

## 2020-10-15 LAB — BCR-ABL1 FISH
Cells Analyzed: 200
Cells Counted: 200

## 2020-12-10 ENCOUNTER — Telehealth: Payer: Self-pay | Admitting: *Deleted

## 2020-12-10 DIAGNOSIS — Z122 Encounter for screening for malignant neoplasm of respiratory organs: Secondary | ICD-10-CM

## 2020-12-10 DIAGNOSIS — Z87891 Personal history of nicotine dependence: Secondary | ICD-10-CM

## 2020-12-10 NOTE — Telephone Encounter (Signed)
Received referral for initial lung cancer screening scan. Contacted patient and obtained smoking history,(former smoker, quit 04/2019, 1 ppd x 37 yrs) as well as answering questions related to screening process. Patient denies signs of lung cancer such as weight loss or hemoptysis. Patient denies comorbidity that would prevent curative treatment if lung cancer were found. Patient is scheduled for CT scan on 12/17/20 @ 3:30 pm. Shared decision making visit already done by patient's PCP.

## 2020-12-16 ENCOUNTER — Telehealth: Payer: Self-pay | Admitting: *Deleted

## 2020-12-16 NOTE — Telephone Encounter (Signed)
Business office informed me that authorization for lung screening scan was not for [facility]. Contacted [facility] PCP office who was the referring providers. They report patient is scheduled with approved auth for lung screening scan at a [facility] location tomorrow at 3pm. Attempted to contact patient and voicemail was left. Via voicemail, instructed patient to call me if he had intended to have his scan here at [facility] facility. Otherwise he should keep his [facility] appt as scheduled.

## 2020-12-17 ENCOUNTER — Ambulatory Visit: Admission: RE | Admit: 2020-12-17 | Payer: No Typology Code available for payment source | Source: Ambulatory Visit

## 2021-04-21 ENCOUNTER — Emergency Department
Admit: 2021-04-21 | Discharge: 2021-04-21 | Discharge status: Against Medical Advice | Payer: No Typology Code available for payment source

## 2021-04-21 NOTE — ED Notes (Signed)
Pt and family spotted leaving ED hospital and getting in the car.  Pt did not inform staff of them leaving.

## 2021-06-06 ENCOUNTER — Emergency Department
Admission: EM | Admit: 2021-06-06 | Discharge: 2021-06-06 | Discharge status: Against Medical Advice | Payer: No Typology Code available for payment source

## 2021-06-06 NOTE — ED Notes (Signed)
Pt to lobby via w/c brought in by EMS for c/o CP; while obtaining report from EMS, pt gets out of w/c and st leaving.Marland KitchenMarland KitchenMarland KitchenMarland Kitchenparamedic followed pt outside and reportedly removed his IV that they had placed enroute

## 2022-06-08 ENCOUNTER — Other Ambulatory Visit: Payer: Self-pay | Admitting: Physician Assistant

## 2022-06-08 ENCOUNTER — Other Ambulatory Visit (HOSPITAL_COMMUNITY): Payer: Self-pay | Admitting: Physician Assistant

## 2022-06-08 DIAGNOSIS — F17210 Nicotine dependence, cigarettes, uncomplicated: Secondary | ICD-10-CM

## 2022-06-08 DIAGNOSIS — Z716 Tobacco abuse counseling: Secondary | ICD-10-CM

## 2022-06-26 IMAGING — CR DG CHEST 2V
1 series · 2 of 2 positions shown · non-contrast
Comparison: Radiograph 11/11/2010

CLINICAL DATA: Shortness of breath, fatigue and chest pain

EXAM:
CHEST - 2 VIEW

[Series 1: dg chest 2 view · 0.14mm/px · 2 of 2 slices shown]
[im 1/2]
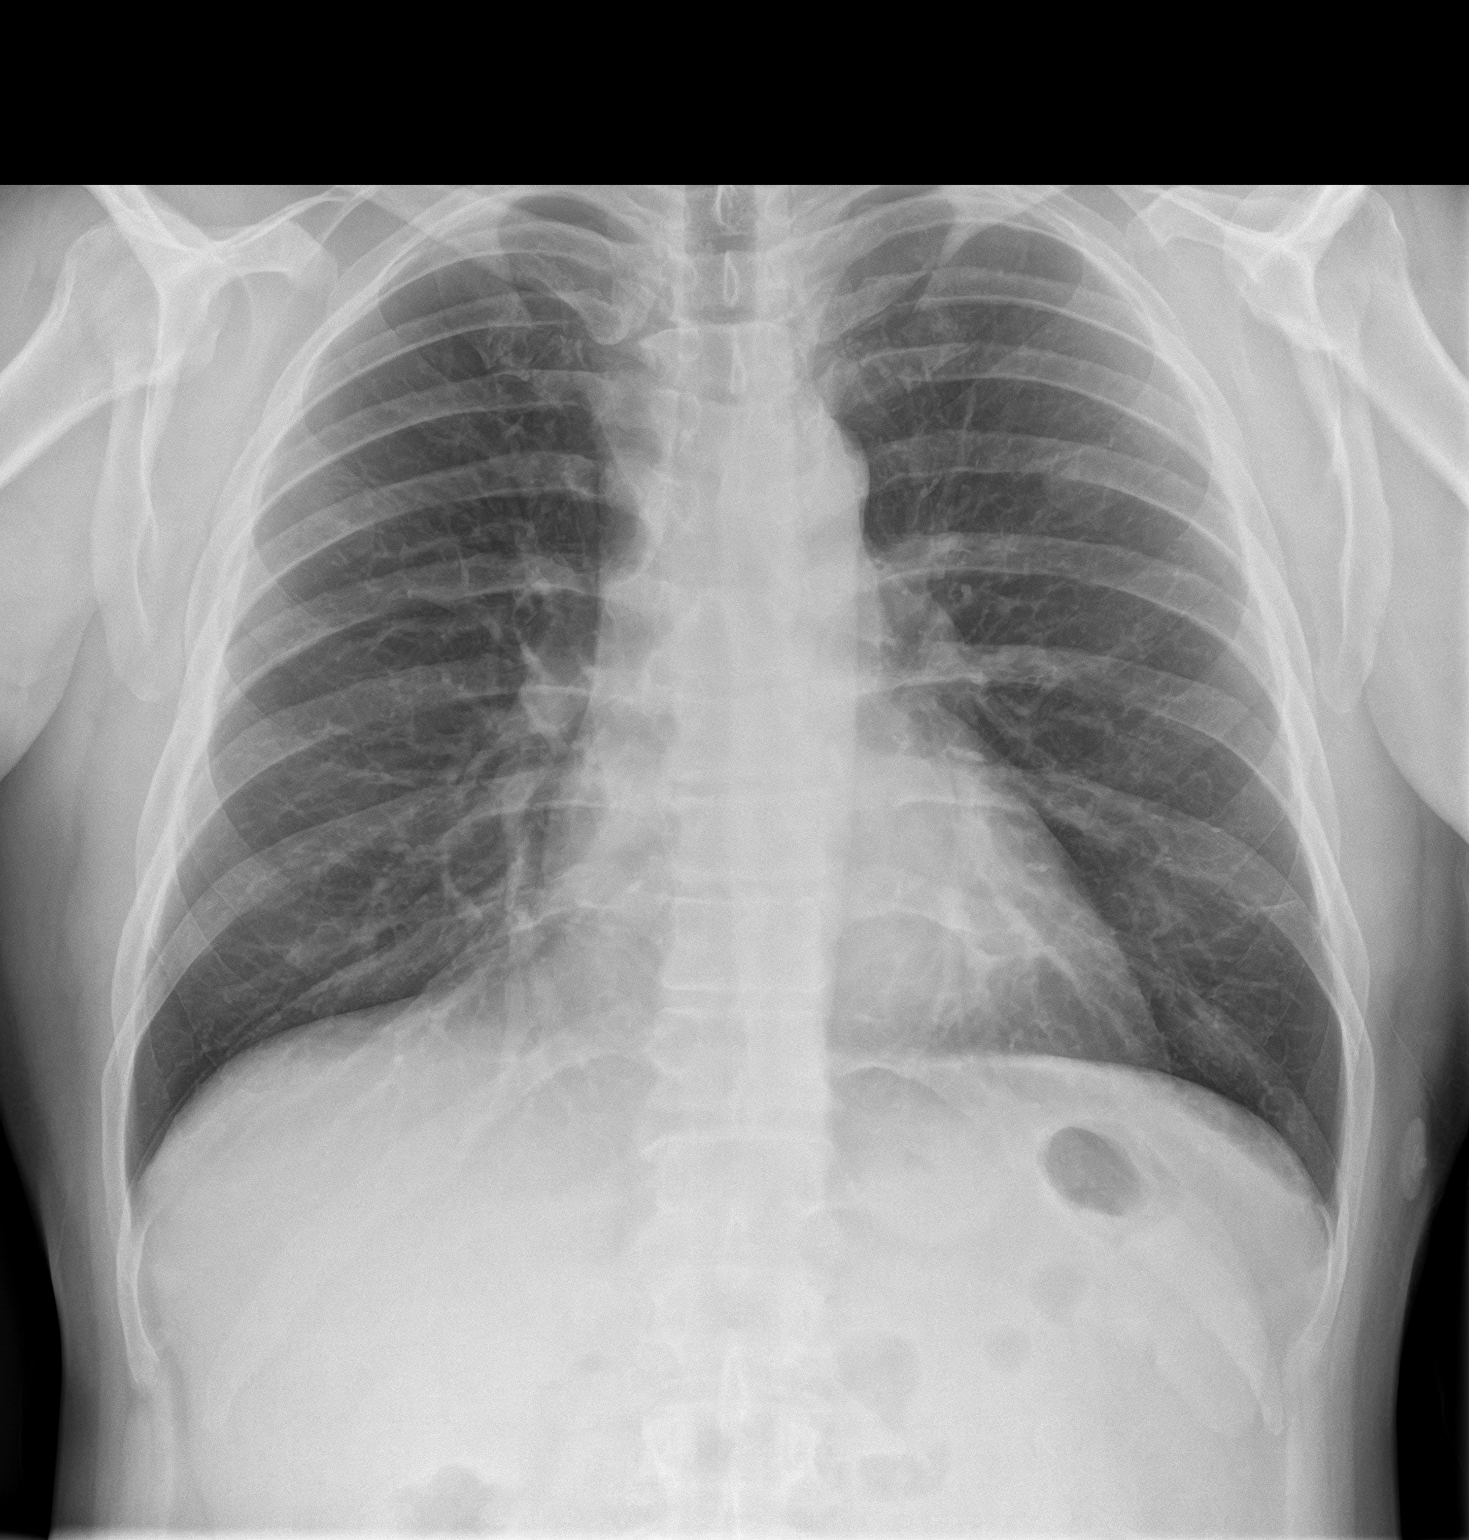
[im 2/2]
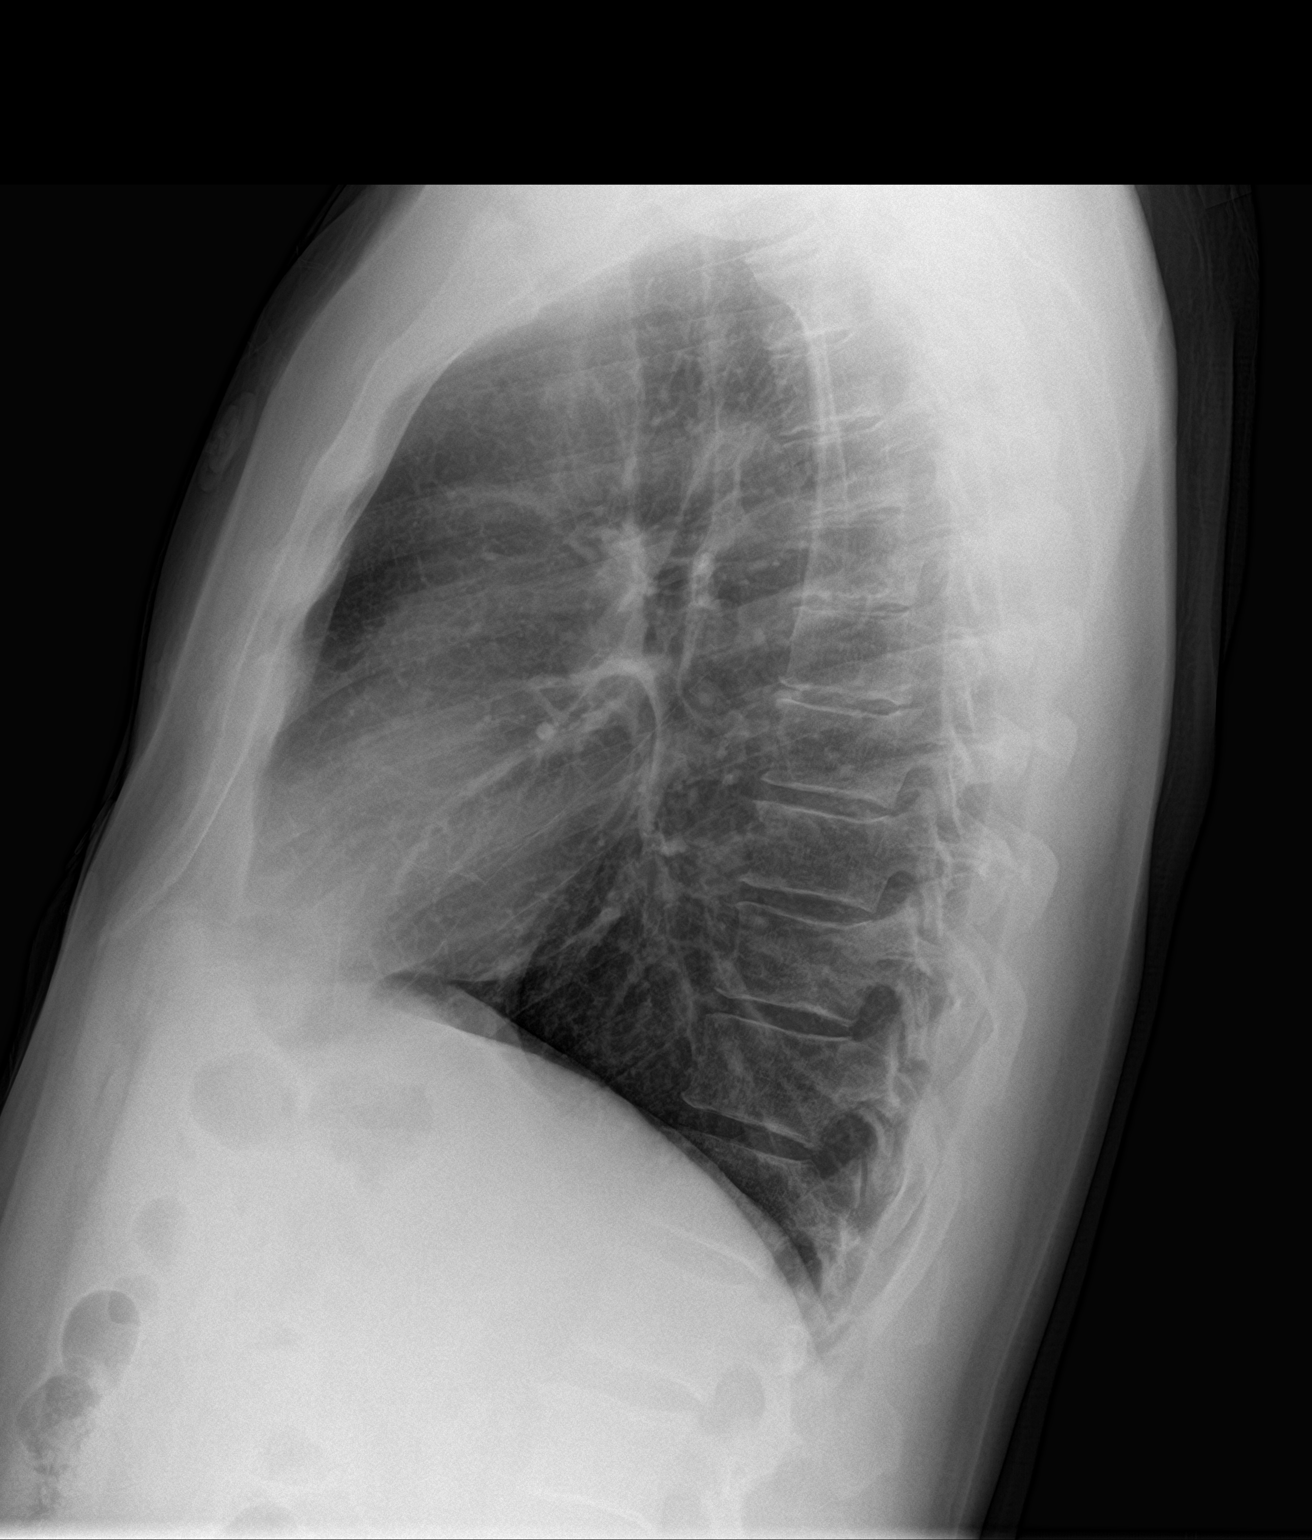

[2 of 2 positions shown; findings below may reference images not displayed]

FINDINGS: No consolidation, features of edema, pneumothorax, or effusion.
Pulmonary vascularity is normally distributed. The cardiomediastinal
contours are unremarkable. No acute osseous or soft tissue
abnormality.
IMPRESSION: No acute cardiopulmonary abnormality.

## 2022-07-03 ENCOUNTER — Ambulatory Visit
Admission: RE | Admit: 2022-07-03 | Discharge: 2022-07-03 | Disposition: A | Discharge status: Home / Self Care | Payer: No Typology Code available for payment source | Source: Ambulatory Visit | Attending: Physician Assistant | Admitting: Physician Assistant

## 2022-07-03 DIAGNOSIS — Z716 Tobacco abuse counseling: Secondary | ICD-10-CM | POA: Insufficient documentation

## 2022-07-03 DIAGNOSIS — F17210 Nicotine dependence, cigarettes, uncomplicated: Secondary | ICD-10-CM | POA: Diagnosis not present

## 2024-06-03 ENCOUNTER — Other Ambulatory Visit: Payer: Self-pay | Admitting: *Deleted

## 2024-06-03 DIAGNOSIS — Z87891 Personal history of nicotine dependence: Secondary | ICD-10-CM

## 2024-06-03 DIAGNOSIS — F17210 Nicotine dependence, cigarettes, uncomplicated: Secondary | ICD-10-CM

## 2024-06-03 DIAGNOSIS — Z122 Encounter for screening for malignant neoplasm of respiratory organs: Secondary | ICD-10-CM

## 2024-06-10 ENCOUNTER — Ambulatory Visit
